# Patient Record
Sex: Female | Born: 1985 | Race: Black or African American | Hispanic: No | Marital: Married | Smoking: Never smoker
Health system: Southern US, Community
[De-identification: ages and names within clinical notes are randomized; demographics above are authoritative.]

## PROBLEM LIST (undated history)

## (undated) DIAGNOSIS — Z803 Family history of malignant neoplasm of breast: Secondary | ICD-10-CM

## (undated) DIAGNOSIS — R87619 Unspecified abnormal cytological findings in specimens from cervix uteri: Secondary | ICD-10-CM

## (undated) HISTORY — DX: Family history of malignant neoplasm of breast: Z80.3

## (undated) HISTORY — PX: COLPOSCOPY: SHX161

## (undated) HISTORY — PX: CHOLECYSTECTOMY: SHX55

## (undated) HISTORY — DX: Unspecified abnormal cytological findings in specimens from cervix uteri: R87.619

## (undated) HISTORY — PX: OTHER SURGICAL HISTORY: SHX169

---

## 2006-10-16 HISTORY — PX: WISDOM TOOTH EXTRACTION: SHX21

## 2008-12-25 ENCOUNTER — Ambulatory Visit: Payer: Self-pay | Admitting: General Practice

## 2011-04-20 ENCOUNTER — Ambulatory Visit: Payer: Self-pay | Admitting: General Practice

## 2013-01-15 ENCOUNTER — Ambulatory Visit: Payer: Self-pay | Admitting: Medical

## 2013-04-02 ENCOUNTER — Emergency Department: Payer: Self-pay | Admitting: Emergency Medicine

## 2013-04-02 LAB — URINALYSIS, COMPLETE
Blood: NEGATIVE
Nitrite: NEGATIVE
Protein: 30
RBC,UR: 6 /HPF (ref 0–5)
Specific Gravity: 1.021 (ref 1.003–1.030)

## 2013-04-02 LAB — LIPASE, BLOOD: Lipase: 150 U/L (ref 73–393)

## 2013-04-02 LAB — COMPREHENSIVE METABOLIC PANEL
Alkaline Phosphatase: 96 U/L (ref 50–136)
Anion Gap: 9 (ref 7–16)
BUN: 5 mg/dL — ABNORMAL LOW (ref 7–18)
Bilirubin,Total: 0.5 mg/dL (ref 0.2–1.0)
Calcium, Total: 9.2 mg/dL (ref 8.5–10.1)
Chloride: 104 mmol/L (ref 98–107)
Co2: 22 mmol/L (ref 21–32)
Creatinine: 0.6 mg/dL (ref 0.60–1.30)
EGFR (Non-African Amer.): >60
Osmolality: 266 (ref 275–301)
SGOT(AST): 32 U/L (ref 15–37)
Total Protein: 7.8 g/dL (ref 6.4–8.2)

## 2013-04-02 LAB — CBC
MCHC: 33.4 g/dL (ref 32.0–36.0)
MCV: 79 fL — ABNORMAL LOW (ref 80–100)
WBC: 15.1 10*3/uL — ABNORMAL HIGH (ref 3.6–11.0)

## 2013-07-02 ENCOUNTER — Observation Stay: Payer: Self-pay

## 2013-07-02 LAB — URINALYSIS, COMPLETE
Bacteria: NONE SEEN
Nitrite: NEGATIVE
Ph: 6 (ref 4.5–8.0)
Protein: 25
RBC,UR: 3 /HPF (ref 0–5)
Squamous Epithelial: 15
WBC UR: 8 /HPF (ref 0–5)

## 2013-10-06 ENCOUNTER — Observation Stay: Payer: Self-pay | Admitting: Obstetrics & Gynecology

## 2013-10-11 ENCOUNTER — Inpatient Hospital Stay: Payer: Self-pay

## 2013-10-11 LAB — CBC WITH DIFFERENTIAL/PLATELET
Basophil #: 0 10*3/uL (ref 0.0–0.1)
Basophil %: 0.2 %
Eosinophil #: 0.1 10*3/uL (ref 0.0–0.7)
Eosinophil %: 0.6 %
HGB: 10.1 g/dL — ABNORMAL LOW (ref 12.0–16.0)
Lymphocyte %: 7.8 %
MCH: 25.2 pg — ABNORMAL LOW (ref 26.0–34.0)
MCHC: 32.4 g/dL (ref 32.0–36.0)
MCV: 78 fL — ABNORMAL LOW (ref 80–100)
Monocyte #: 1.1 x10 3/mm — ABNORMAL HIGH (ref 0.2–0.9)
Monocyte %: 5.9 %
Neutrophil #: 15.9 10*3/uL — ABNORMAL HIGH (ref 1.4–6.5)
Neutrophil %: 85.5 %
Platelet: 191 10*3/uL (ref 150–440)
RDW: 17.8 % — ABNORMAL HIGH (ref 11.5–14.5)
WBC: 18.6 10*3/uL — ABNORMAL HIGH (ref 3.6–11.0)

## 2013-10-11 LAB — RUPTURE OF MEMBRANE PLUS: Rom Plus: DETECTED

## 2013-10-12 LAB — CBC
HGB: 8.8 g/dL — ABNORMAL LOW (ref 12.0–16.0)
Platelet: 193 10*3/uL (ref 150–440)
RDW: 17.5 % — ABNORMAL HIGH (ref 11.5–14.5)
WBC: 28.2 10*3/uL — ABNORMAL HIGH (ref 3.6–11.0)

## 2013-10-13 LAB — HEMATOCRIT: HCT: 27.2 % — ABNORMAL LOW (ref 35.0–47.0)

## 2013-11-11 ENCOUNTER — Ambulatory Visit: Payer: Self-pay | Admitting: Medical

## 2013-11-28 ENCOUNTER — Encounter: Payer: Self-pay | Admitting: Obstetrics and Gynecology

## 2013-12-14 ENCOUNTER — Encounter: Payer: Self-pay | Admitting: Obstetrics and Gynecology

## 2014-01-09 ENCOUNTER — Ambulatory Visit: Payer: Self-pay | Admitting: Surgery

## 2014-01-09 LAB — CBC WITH DIFFERENTIAL/PLATELET
BASOS PCT: 0.6 %
Basophil #: 0 10*3/uL (ref 0.0–0.1)
Eosinophil #: 0.1 10*3/uL (ref 0.0–0.7)
Eosinophil %: 1.9 %
HCT: 32.1 % — ABNORMAL LOW (ref 35.0–47.0)
HGB: 10.1 g/dL — ABNORMAL LOW (ref 12.0–16.0)
LYMPHS ABS: 1.8 10*3/uL (ref 1.0–3.6)
LYMPHS PCT: 23.8 %
MCH: 23.8 pg — ABNORMAL LOW (ref 26.0–34.0)
MCHC: 31.4 g/dL — AB (ref 32.0–36.0)
MCV: 76 fL — ABNORMAL LOW (ref 80–100)
MONO ABS: 0.4 x10 3/mm (ref 0.2–0.9)
Monocyte %: 5.1 %
NEUTROS ABS: 5.1 10*3/uL (ref 1.4–6.5)
Neutrophil %: 68.6 %
Platelet: 264 10*3/uL (ref 150–440)
RBC: 4.24 10*6/uL (ref 3.80–5.20)
RDW: 15.6 % — ABNORMAL HIGH (ref 11.5–14.5)
WBC: 7.4 10*3/uL (ref 3.6–11.0)

## 2014-01-09 LAB — BASIC METABOLIC PANEL
Anion Gap: 5 — ABNORMAL LOW (ref 7–16)
BUN: 7 mg/dL (ref 7–18)
CREATININE: 0.91 mg/dL (ref 0.60–1.30)
Calcium, Total: 8.8 mg/dL (ref 8.5–10.1)
Chloride: 108 mmol/L — ABNORMAL HIGH (ref 98–107)
Co2: 26 mmol/L (ref 21–32)
GLUCOSE: 81 mg/dL (ref 65–99)
Osmolality: 275 (ref 275–301)
POTASSIUM: 3.5 mmol/L (ref 3.5–5.1)
SODIUM: 139 mmol/L (ref 136–145)

## 2014-01-09 LAB — HEPATIC FUNCTION PANEL A (ARMC)
ALBUMIN: 3.4 g/dL (ref 3.4–5.0)
ALK PHOS: 119 U/L — AB
ALT: 28 U/L (ref 12–78)
Bilirubin,Total: 0.4 mg/dL (ref 0.2–1.0)
SGOT(AST): 14 U/L — ABNORMAL LOW (ref 15–37)
Total Protein: 7.7 g/dL (ref 6.4–8.2)

## 2014-01-12 LAB — PATHOLOGY REPORT

## 2014-01-14 ENCOUNTER — Encounter: Payer: Self-pay | Admitting: Obstetrics and Gynecology

## 2015-02-06 NOTE — Op Note (Signed)
PATIENT NAME:  Kilcrease, Ziona H MR#:  [id number] DATE OF BIRTH:  1986/02/15  DATE OF PROCEDURE:  10/15/2013  PREOPERATIVE DIAGNOSIS: Urgent cesarean section with probable 2 knots in the cord. (Dictation Anomaly)deep repititative variables with suggestion of a true knot in the cord with amnioinfusion  done for variables, nonresponsive to conservative measures (Dictation Anomaly) with fetal intolerance to labor remote from delivery.   POSTOPERATIVE DIAGNOSES: Deep impaction of fetal head in vagina at 5 cm dilation with true knot in the cord. (Dictation Anomaly)light meconium stained  placenta  with fetus requiring resuscitation,  intubation and chest compressions. With Apgars 3, 5, and 8 with a birth weight of 7 pounds 11 ounces, female fetus.  ESTIMATED BLOOD LOSS: 1000.  SURGEON: Elliot Gurney, M.D.  FINDINGS: Fibroid uterus with a large approximately egg sized fibroid on the anterior aspect of uterus, the same space as the baby, one in the posterior aspect of the uterus, one on the anterior aspect fundally towards the tube on the left side with bowel omentum and tube all entangled in adhesions on that side. Normal right tube and ovary with a T-scar(Dictation Anomaly)e  of  the uterus. Forever requiring a csection for delivery.  PROCEDURE: Primary cesarean section resulting in a T-shaped incision on the uterus for  delivery of the fetus.  The patient was taken to the Operating Room and placed in supine position after epidural anesthesia had been instilled.  (Dictation Anomaly)fetal heart tones  on the table were found to be 67, after (Dictation Anomaly)which stat cesarean section was performed. The incision was carried sharply down to the fascia. The fascia was nicked in the midline. The incision was extended in a superolateral manner. With the curved Mayo scissors, the anterior muscles were sharply and bluntly dissected off the rectus fascia. The muscle belly midline was identified, split, opened.  The peritoneum was grasped and opened. The bladder blade was placed and the uterine incision was made. At this time the surgeon's hand was placed down to remove the infant and the infant's head was impacted in the vagina. The infant's head was under severe suction and attempts was made to push the head up from below by the nurse. (Dictation Anomaly)the suction being released when the head was pushed out of the pelvis were heard, but the suction kept the fetus in there. After multiple attempts to remove the head, incision was extended anteriorly  (Dictation Anomaly) in attemopt to deliver  the fetus by feet first. This was not able to be done. The incision was then extended down through the midline to the cervix.  Another assistant then helped push the head up, suction was released and the infant's head was removed. The infant's cord blood was lavaged back into the infant's belly; the cord was clamped and cut and infant was handed off to a pediatrician. Attention was then turned to the patient's uterus and  the uterus was exteriorized and wrapped in a moist laparotomy sponge after delivery of the placenta. The aforementioned findings were seen. There was not much bleeding in the uterus at this time. The uterine incision was closed. First the vertical incision from the cervix up to the lower uterine segment, this was done in a locked chromic suture and then an imbricating suture on top of this. Then the vertical incision at the mid-fundus of the uterus was repaired in layers, first with a chromic locked suture and then an imbricating chromic suture. The lower uterine segment was then closed with a  locked chromic suture and then an imbricating suture. A 3-0 chromic was used to tack the bladder back up to the uterine incision. The aforementioned fibroids were seen. No attempt was made to remove these as this would possibly increase the bleeding at the uterus. At this time, (Dictation Anomaly)attention was turned to the  fibroid and clamped with Kelly clamps, cut, and suture tied to remove them.  The tube which was looped backward but then it released and was found to be in very good condition. No indentations and no cuts were identified. The uterus was then replaced back into the abdomen. The muscle bellies were closed with a running Vicryl suture after the gutters had been cleared of blood clots. Before the muscles were closed, the bladder was backfilled with sterile (Dictation Anomaly)formula  to identify any leaks or any rents or tears of the ureter. There was no spill of sterile (Dictation Anomaly)formula  into the belly.  The surgery was continued. The muscle bellies were approximated and closed with 0 Vicryl suture. The ON-Q catheters were placed and placed in between the muscle belly and the fascia. The fascia was closed with a running Vicryl suture. A large (Dictation Anomaly)TLH  plain gut suture was used to approximate the subcutaneous fat, pain pump  was placed. Bandage was placed. Uterus was massaged and there was no blood or clots at the uterus.  There was some blood-tinged urine in the catheter from prior to the repair while baby's head was being manipulated, but clear urine was noted in the bag after the milk(Dictation Anomaly)in the bag and tube.  The patient was then taken to recovery after having tolerated the procedure well.    ____________________________ Elliot Gurneyarrie C. Paschal Blanton, MD cck:sg D: 10/15/2013 09:50:31 ET T: 10/15/2013 11:08:08 ET JOB#: [id number]  cc: Elliot Gurneyarrie C. Kamarie Veno, MD, <Dictator> Elliot GurneyARRIE C Tolbert Matheson MD ELECTRONICALLY SIGNED 10/16/2013 14:52

## 2015-02-06 NOTE — Op Note (Signed)
PATIENT NAME:  Peggy Garcia, Peggy Garcia MR#:  [id number] DATE OF BIRTH:  05-11-1986  DATE OF PROCEDURE:  01/09/2014  PREOPERATIVE DIAGNOSIS: Symptomatic cholelithiasis.   POSTOPERATIVE DIAGNOSIS: Symptomatic cholelithiasis.   PROCEDURE PERFORMED:  Laparoscopic cholecystectomy.   ANESTHESIA: General endotracheal.   ESTIMATED BLOOD LOSS: Minimal.   SPECIMEN:  Gallbladder.  COMPLICATIONS: None.   INDICATION FOR SURGERY: [name] is a pleasant 29 year old female with recurrent right upper quadrant pain and gallstones. She was brought to the operating room for laparoscopic cholecystectomy.   DETAILS OF THE PROCEDURE:  Are as follows: Informed consent was obtained. Ms. Donoso was brought to the operating room suite. She was induced. Endotracheal tube was placed, general anesthesia was administered. Her abdomen was then prepped and draped in standard surgical fashion. A timeout was then performed correctly identifying patient name, operative site and procedure to be performed. A supraumbilical incision was made. It was deepened down to the fascia. The fascia was incised. The peritoneum was entered. Two stay sutures were placed through the fasciotomy. Hasson trocar was placed in the abdomen. The abdomen was insufflated. The gallbladder was visualized. It appeared to not be severely inflamed. An 11 mm epigastric and two 5 mm right subcostal trocars were placed; the latter in the midclavicular and anterior axillary line. The gallbladder was then grasped and retracted over the dome of the liver. The cystic artery and cystic duct were dissected out. A critical view was obtained. The 2 structures were clipped and ligated. The gallbladder was then taken off the gallbladder fossa with cautery and taken out with an Endo Catch bag through the supraumbilical port.  The gallbladder fossa was then examined and noted to be hemostatic. The abdomen was irrigated. The fossa was again looked at and noted to be hemostatic.  The clips appear to be in place. The abdomen was then desufflated. The supraumbilical fascia was closed with a figure-of-eight 0 Vicryl. The skin was then closed with interrupted 4-0 Monocryl deep dermal sutures. Steri-Strips, Telfa gauze and Tegaderm were used to complete the dressing. The patient was then awoken, extubated and brought to the postanesthesia care unit. There were no immediate complications. Needle, sponge  and instrument count was correct at the end of the procedure.    ____________________________ Si Raiderhristopher A. Lylith Bebeau, MD cal:dmm D: 01/09/2014 17:18:55 ET T: 01/09/2014 22:41:26 ET JOB#: [id number]  cc: Cristal Deerhristopher A. Jaquane Boughner, MD, <Dictator> Jarvis NewcomerHRISTOPHER A Jessina Marse MD ELECTRONICALLY SIGNED 01/14/2014 12:26

## 2015-02-23 NOTE — H&P (Signed)
L&D Evaluation:  History Expanded:  HPI 29 yo G1P0 at 39 weeks w bloody show today, no inciting event and no ROM or Ctxs.  [facility] without complication.   Gravida 1   Term 0   Patient's Medical History No Chronic Illness   Patient's Surgical History none   Medications Pre Natal Vitamins   Allergies NKDA   Social History none   Family History Non-Contributory   ROS:  ROS All systems were reviewed.  HEENT, CNS, GI, GU, Respiratory, CV, Renal and Musculoskeletal systems were found to be normal.   Exam:  Vital Signs stable   General no apparent distress   Mental Status clear   Abdomen gravid, non-tender   Estimated Fetal Weight Average for gestational age   Back no CVAT   Edema no edema   Pelvic no external lesions, FT, 50%, -2, no bleeding   Mebranes Intact   FHT normal rate with no decels, R Non-Stress Test   Ucx absent   Skin dry   Impression:  Impression Vag bleeding, no signs of labor.   Plan:  Plan EFM/NST, monitor contractions and for cervical change   Comments Two exams, no cervical change.  Labor precautions discussed.  Appt Friday, Induction of labor Monday.   Electronic Signatures: Letitia LibraHarris, Holiday Mcmenamin Paul (MD)  (Signed 22-Dec-14 12:35)  Authored: L&D Evaluation   Last Updated: 22-Dec-14 12:35 by Letitia LibraHarris, Lugene Beougher Paul (MD)

## 2015-02-23 NOTE — H&P (Signed)
L&D Evaluation:  History:  HPI Pt is a 29 yo G2P0010 at 26.2 weeks who presents to L&D with reports of left lower abdominal pain since monday. She was seen in the office yesterday and was given comfort measures and medication for a presumed fibroid seen on a previous u/s which could be causing some discomfort. Pt denied any urinary or vaginal symptoms at that time. Pt states that the tramadol is not working and she continues to have pain. She admits that she has not taken anything for pain since 7am today. She reports +FM, and denies VB, lof, or ctx. She is B+, RI, VI.   Presents with abdominal pain   Patient's Medical History No Chronic Illness   Patient's Surgical History colpo, widsom teeth removal   Medications Pre Natal Vitamins   Allergies NKDA   ROS:  General normal   HEENT normal   CNS normal   GI normal   GU normal   Resp normal   CV normal   Renal normal   MS c/o right abdominal pain   Exam:  Vital Signs stable   Urine Protein 25 on UA   General no apparent distress   Mental Status clear   Chest clear   Heart normal sinus rhythm   Abdomen gravid, non-tender   Back no CVAT, some diffuse back pain.   Edema no edema   Mebranes Intact   FHT normal rate with no decels   Ucx absent   Skin dry, no lesions   Lymph no lymphadenopathy   Other urine- 2+ketones, specific gravity 1.015, 1+blood, 25 protein, negative nitrites, 1+leuks, 3 RBCs, 8 WBCs, no bacteria   Impression:  Impression UTI, reactive NST   Plan:  Plan fluids, antibiotics, will get u/s of abdomen   Follow Up Appointment need to schedule. [facility]   Electronic Signatures: Jannet MantisSubudhi, Briany Aye (CNM)  (Signed 17-Sep-14 17:41)  Authored: L&D Evaluation   Last Updated: 17-Sep-14 17:41 by Jannet MantisSubudhi, Janecia Palau (CNM)

## 2015-02-23 NOTE — H&P (Signed)
L&D Evaluation:  History Expanded:  HPI 29 yo G2P0010 at 6740 w 4days who was to be induced tomorrow night presents in labor at 3-4 changing to 4-5 on her own, she was a [facility] 9 she got tdap on 11/10. she is GBS pos and she is ruptured,   Gravida 2   Term 0   PreTerm 0   Abortion 1   Living 0   Blood Type (Maternal) B positive   Group B Strep Results Maternal (Result >5wks must be treated as unknown) positive   Maternal HIV Negative   Maternal Syphilis Ab Nonreactive   Maternal Varicella Immune   Rubella Results (Maternal) immune   Maternal T-Dap Immune   EDC 07-Oct-2013   Presents with contractions, srom   Patient's Medical History No Chronic Illness   Patient's Surgical History none   Medications Pre Natal Vitamins   Allergies NKDA   Social History none   Family History Non-Contributory   ROS:  ROS All systems were reviewed.  HEENT, CNS, GI, GU, Respiratory, CV, Renal and Musculoskeletal systems were found to be normal.   Exam:  Vital Signs stable   Urine Protein not completed   General no apparent distress   Mental Status clear   Chest clear   Heart normal sinus rhythm   Abdomen gravid, tender with contractions   Estimated Fetal Weight Average for gestational age   Fetal Position v   Fundal Height term   Back no CVAT   Edema no edema   Reflexes 1+   Mebranes Ruptured   Description clear   FHT normal rate with no decels, cat 1   Fetal Heart Rate 140   Ucx irregular   Skin dry   Lymph no lymphadenopathy   Impression:  Impression early labor   Plan:  Plan UA, antibiotics for GBBS prophylaxis   Comments start GBS prophylaxis and  anticipate SVD   Follow Up Appointment need to schedule. in 6 weeks   Electronic Signatures: Adria DevonKlett, Ahmed Inniss (MD)  (Signed 27-Dec-14 21:45)  Authored: L&D Evaluation   Last Updated: 27-Dec-14 21:45 by Adria DevonKlett, Sharalee Witman (MD)

## 2016-05-22 DIAGNOSIS — Z98891 History of uterine scar from previous surgery: Secondary | ICD-10-CM | POA: Insufficient documentation

## 2016-05-22 DIAGNOSIS — Z6841 Body Mass Index (BMI) 40.0 and over, adult: Secondary | ICD-10-CM | POA: Insufficient documentation

## 2016-05-22 DIAGNOSIS — Z836 Family history of other diseases of the respiratory system: Secondary | ICD-10-CM | POA: Insufficient documentation

## 2016-07-31 DIAGNOSIS — Z836 Family history of other diseases of the respiratory system: Secondary | ICD-10-CM | POA: Diagnosis not present

## 2016-07-31 DIAGNOSIS — O3410 Maternal care for benign tumor of corpus uteri, unspecified trimester: Secondary | ICD-10-CM | POA: Diagnosis not present

## 2016-07-31 DIAGNOSIS — O4440 Low lying placenta NOS or without hemorrhage, unspecified trimester: Secondary | ICD-10-CM | POA: Diagnosis not present

## 2016-07-31 DIAGNOSIS — Z3A08 8 weeks gestation of pregnancy: Secondary | ICD-10-CM | POA: Diagnosis not present

## 2016-07-31 DIAGNOSIS — D259 Leiomyoma of uterus, unspecified: Secondary | ICD-10-CM | POA: Diagnosis not present

## 2016-07-31 DIAGNOSIS — O99211 Obesity complicating pregnancy, first trimester: Secondary | ICD-10-CM | POA: Diagnosis not present

## 2016-07-31 DIAGNOSIS — Z87440 Personal history of urinary (tract) infections: Secondary | ICD-10-CM | POA: Diagnosis not present

## 2016-07-31 DIAGNOSIS — O99210 Obesity complicating pregnancy, unspecified trimester: Secondary | ICD-10-CM | POA: Diagnosis not present

## 2016-07-31 DIAGNOSIS — Z98891 History of uterine scar from previous surgery: Secondary | ICD-10-CM | POA: Diagnosis not present

## 2016-07-31 DIAGNOSIS — Z3A18 18 weeks gestation of pregnancy: Secondary | ICD-10-CM | POA: Diagnosis not present

## 2016-10-06 DIAGNOSIS — Z3A33 33 weeks gestation of pregnancy: Secondary | ICD-10-CM | POA: Diagnosis not present

## 2016-10-06 DIAGNOSIS — O99210 Obesity complicating pregnancy, unspecified trimester: Secondary | ICD-10-CM | POA: Diagnosis not present

## 2016-10-06 DIAGNOSIS — Z3A27 27 weeks gestation of pregnancy: Secondary | ICD-10-CM | POA: Diagnosis not present

## 2016-10-06 DIAGNOSIS — Z23 Encounter for immunization: Secondary | ICD-10-CM | POA: Diagnosis not present

## 2016-10-06 DIAGNOSIS — O2340 Unspecified infection of urinary tract in pregnancy, unspecified trimester: Secondary | ICD-10-CM | POA: Diagnosis not present

## 2016-10-06 DIAGNOSIS — O4440 Low lying placenta NOS or without hemorrhage, unspecified trimester: Secondary | ICD-10-CM | POA: Diagnosis not present

## 2016-10-06 DIAGNOSIS — Z98891 History of uterine scar from previous surgery: Secondary | ICD-10-CM | POA: Diagnosis not present

## 2016-10-06 DIAGNOSIS — O99810 Abnormal glucose complicating pregnancy: Secondary | ICD-10-CM | POA: Diagnosis not present

## 2016-10-20 DIAGNOSIS — Z3A29 29 weeks gestation of pregnancy: Secondary | ICD-10-CM | POA: Diagnosis not present

## 2016-10-20 DIAGNOSIS — Z23 Encounter for immunization: Secondary | ICD-10-CM | POA: Diagnosis not present

## 2016-10-20 DIAGNOSIS — O99210 Obesity complicating pregnancy, unspecified trimester: Secondary | ICD-10-CM | POA: Diagnosis not present

## 2016-10-20 DIAGNOSIS — Z98891 History of uterine scar from previous surgery: Secondary | ICD-10-CM | POA: Diagnosis not present

## 2016-11-06 DIAGNOSIS — Z6841 Body Mass Index (BMI) 40.0 and over, adult: Secondary | ICD-10-CM | POA: Diagnosis not present

## 2016-11-06 DIAGNOSIS — O99013 Anemia complicating pregnancy, third trimester: Secondary | ICD-10-CM | POA: Diagnosis not present

## 2016-11-06 DIAGNOSIS — O99210 Obesity complicating pregnancy, unspecified trimester: Secondary | ICD-10-CM | POA: Diagnosis not present

## 2016-11-06 DIAGNOSIS — O99213 Obesity complicating pregnancy, third trimester: Secondary | ICD-10-CM | POA: Diagnosis not present

## 2016-11-06 DIAGNOSIS — Z369 Encounter for antenatal screening, unspecified: Secondary | ICD-10-CM | POA: Diagnosis not present

## 2016-11-06 DIAGNOSIS — Z3A27 27 weeks gestation of pregnancy: Secondary | ICD-10-CM | POA: Diagnosis not present

## 2016-11-06 DIAGNOSIS — Z98891 History of uterine scar from previous surgery: Secondary | ICD-10-CM | POA: Diagnosis not present

## 2016-11-06 DIAGNOSIS — O3410 Maternal care for benign tumor of corpus uteri, unspecified trimester: Secondary | ICD-10-CM | POA: Diagnosis not present

## 2016-11-06 DIAGNOSIS — Z3A32 32 weeks gestation of pregnancy: Secondary | ICD-10-CM | POA: Diagnosis not present

## 2016-11-06 DIAGNOSIS — D259 Leiomyoma of uterus, unspecified: Secondary | ICD-10-CM | POA: Diagnosis not present

## 2016-11-06 DIAGNOSIS — O2340 Unspecified infection of urinary tract in pregnancy, unspecified trimester: Secondary | ICD-10-CM | POA: Diagnosis not present

## 2016-11-16 DIAGNOSIS — K080 Exfoliation of teeth due to systemic causes: Secondary | ICD-10-CM | POA: Diagnosis not present

## 2016-11-27 DIAGNOSIS — Z98891 History of uterine scar from previous surgery: Secondary | ICD-10-CM | POA: Diagnosis not present

## 2016-11-27 DIAGNOSIS — O99013 Anemia complicating pregnancy, third trimester: Secondary | ICD-10-CM | POA: Diagnosis not present

## 2016-11-27 DIAGNOSIS — O99210 Obesity complicating pregnancy, unspecified trimester: Secondary | ICD-10-CM | POA: Diagnosis not present

## 2016-11-27 DIAGNOSIS — Z3A35 35 weeks gestation of pregnancy: Secondary | ICD-10-CM | POA: Diagnosis not present

## 2016-12-04 DIAGNOSIS — O99210 Obesity complicating pregnancy, unspecified trimester: Secondary | ICD-10-CM | POA: Diagnosis not present

## 2016-12-04 DIAGNOSIS — Z3A36 36 weeks gestation of pregnancy: Secondary | ICD-10-CM | POA: Diagnosis not present

## 2016-12-04 DIAGNOSIS — O99013 Anemia complicating pregnancy, third trimester: Secondary | ICD-10-CM | POA: Diagnosis not present

## 2016-12-04 DIAGNOSIS — Z98891 History of uterine scar from previous surgery: Secondary | ICD-10-CM | POA: Diagnosis not present

## 2016-12-12 DIAGNOSIS — D649 Anemia, unspecified: Secondary | ICD-10-CM | POA: Diagnosis not present

## 2016-12-12 DIAGNOSIS — O9902 Anemia complicating childbirth: Secondary | ICD-10-CM | POA: Diagnosis not present

## 2016-12-12 DIAGNOSIS — Z87440 Personal history of urinary (tract) infections: Secondary | ICD-10-CM | POA: Diagnosis not present

## 2016-12-12 DIAGNOSIS — K219 Gastro-esophageal reflux disease without esophagitis: Secondary | ICD-10-CM | POA: Diagnosis not present

## 2016-12-12 DIAGNOSIS — D259 Leiomyoma of uterus, unspecified: Secondary | ICD-10-CM | POA: Diagnosis not present

## 2016-12-12 DIAGNOSIS — Z836 Family history of other diseases of the respiratory system: Secondary | ICD-10-CM | POA: Diagnosis not present

## 2016-12-12 DIAGNOSIS — O34211 Maternal care for low transverse scar from previous cesarean delivery: Secondary | ICD-10-CM | POA: Diagnosis not present

## 2016-12-12 DIAGNOSIS — O99613 Diseases of the digestive system complicating pregnancy, third trimester: Secondary | ICD-10-CM | POA: Diagnosis not present

## 2016-12-12 DIAGNOSIS — O99214 Obesity complicating childbirth: Secondary | ICD-10-CM | POA: Diagnosis not present

## 2016-12-12 DIAGNOSIS — E669 Obesity, unspecified: Secondary | ICD-10-CM | POA: Diagnosis not present

## 2016-12-12 DIAGNOSIS — O3413 Maternal care for benign tumor of corpus uteri, third trimester: Secondary | ICD-10-CM | POA: Diagnosis not present

## 2016-12-12 DIAGNOSIS — O99210 Obesity complicating pregnancy, unspecified trimester: Secondary | ICD-10-CM | POA: Diagnosis not present

## 2016-12-12 DIAGNOSIS — O99212 Obesity complicating pregnancy, second trimester: Secondary | ICD-10-CM | POA: Diagnosis not present

## 2016-12-12 DIAGNOSIS — R768 Other specified abnormal immunological findings in serum: Secondary | ICD-10-CM | POA: Diagnosis not present

## 2016-12-12 DIAGNOSIS — Z3A01 Less than 8 weeks gestation of pregnancy: Secondary | ICD-10-CM | POA: Diagnosis not present

## 2016-12-12 DIAGNOSIS — Z3A37 37 weeks gestation of pregnancy: Secondary | ICD-10-CM | POA: Diagnosis not present

## 2016-12-12 DIAGNOSIS — O34212 Maternal care for vertical scar from previous cesarean delivery: Secondary | ICD-10-CM | POA: Diagnosis not present

## 2016-12-12 DIAGNOSIS — O3410 Maternal care for benign tumor of corpus uteri, unspecified trimester: Secondary | ICD-10-CM | POA: Diagnosis not present

## 2016-12-12 DIAGNOSIS — Z6841 Body Mass Index (BMI) 40.0 and over, adult: Secondary | ICD-10-CM | POA: Diagnosis not present

## 2016-12-13 DIAGNOSIS — R768 Other specified abnormal immunological findings in serum: Secondary | ICD-10-CM | POA: Insufficient documentation

## 2016-12-13 DIAGNOSIS — O34212 Maternal care for vertical scar from previous cesarean delivery: Secondary | ICD-10-CM | POA: Diagnosis not present

## 2016-12-13 DIAGNOSIS — Z3A37 37 weeks gestation of pregnancy: Secondary | ICD-10-CM | POA: Diagnosis not present

## 2016-12-13 DIAGNOSIS — R7689 Other specified abnormal immunological findings in serum: Secondary | ICD-10-CM | POA: Insufficient documentation

## 2016-12-13 DIAGNOSIS — O9902 Anemia complicating childbirth: Secondary | ICD-10-CM | POA: Diagnosis not present

## 2016-12-13 DIAGNOSIS — O99212 Obesity complicating pregnancy, second trimester: Secondary | ICD-10-CM | POA: Diagnosis not present

## 2017-01-26 DIAGNOSIS — Z3049 Encounter for surveillance of other contraceptives: Secondary | ICD-10-CM | POA: Diagnosis not present

## 2017-01-26 DIAGNOSIS — Z30017 Encounter for initial prescription of implantable subdermal contraceptive: Secondary | ICD-10-CM | POA: Diagnosis not present

## 2017-06-05 DIAGNOSIS — K080 Exfoliation of teeth due to systemic causes: Secondary | ICD-10-CM | POA: Diagnosis not present

## 2017-08-21 ENCOUNTER — Ambulatory Visit: Payer: Self-pay | Admitting: Medical

## 2017-08-21 ENCOUNTER — Ambulatory Visit
Admission: RE | Admit: 2017-08-21 | Discharge: 2017-08-21 | Disposition: A | Discharge status: Home / Self Care | Payer: Federal, State, Local not specified - PPO | Source: Ambulatory Visit | Attending: Medical | Admitting: Medical

## 2017-08-21 ENCOUNTER — Encounter: Payer: Self-pay | Admitting: Medical

## 2017-08-21 VITALS — BP 112/74 | HR 108 | Temp 99.7°F | Resp 16 | Ht 62.0 in | Wt 209.0 lb

## 2017-08-21 DIAGNOSIS — G8929 Other chronic pain: Secondary | ICD-10-CM | POA: Diagnosis not present

## 2017-08-21 DIAGNOSIS — M545 Low back pain, unspecified: Secondary | ICD-10-CM

## 2017-08-21 DIAGNOSIS — H6502 Acute serous otitis media, left ear: Secondary | ICD-10-CM

## 2017-08-21 DIAGNOSIS — J029 Acute pharyngitis, unspecified: Secondary | ICD-10-CM

## 2017-08-21 MED ORDER — AMOXICILLIN-POT CLAVULANATE 875-125 MG PO TABS: 1.0000 | ORAL_TABLET | Freq: Two times a day (BID) | ORAL | 0 refills | Status: DC

## 2017-08-21 NOTE — Patient Instructions (Addendum)
Pharyngitis Pharyngitis is a sore throat (pharynx). There is redness, pain, and swelling of your throat. Follow these instructions at home:  Drink enough fluids to keep your pee (urine) clear or pale yellow.  Only take medicine as told by your doctor. ? You may get sick again if you do not take medicine as told. Finish your medicines, even if you start to feel better. ? Do not take aspirin.  Rest.  Rinse your mouth (gargle) with salt water ( tsp of salt per 1 qt of water) every 1-2 hours. This will help the pain.  If you are not at risk for choking, you can suck on hard candy or sore throat lozenges. Contact a doctor if:  You have large, tender lumps on your neck.  You have a rash.  You cough up green, yellow-brown, or bloody spit. Get help right away if:  You have a stiff neck.  You drool or cannot swallow liquids.  You throw up (vomit) or are not able to keep medicine or liquids down.  You have very bad pain that does not go away with medicine.  You have problems breathing (not from a stuffy nose). This information is not intended to replace advice given to you by your health care provider. Make sure you discuss any questions you have with your health care provider. Document Released: 03/20/2008 Document Revised: 03/09/2016 Document Reviewed: 06/09/2013 Elsevier Interactive Patient Education  2017 [employer] Otitis Media, Adult Otitis media is redness, soreness, and puffiness (swelling) in the space just behind your eardrum (middle ear). It may be caused by allergies or infection. It often happens along with a cold. Follow these instructions at home:  Take your medicine as told. Finish it even if you start to feel better.  Only take over-the-counter or prescription medicines for pain, discomfort, or fever as told by your doctor.  Follow up with your doctor as told. Contact a doctor if:  You have otitis media only in one ear, or bleeding from your nose, or  both.  You notice a lump on your neck.  You are not getting better in 3-5 days.  You feel worse instead of better. Get help right away if:  You have pain that is not helped with medicine.  You have puffiness, redness, or pain around your ear.  You get a stiff neck.  You cannot move part of your face (paralysis).  You notice that the bone behind your ear hurts when you touch it. This information is not intended to replace advice given to you by your health care provider. Make sure you discuss any questions you have with your health care provider. Document Released: 03/20/2008 Document Revised: 03/09/2016 Document Reviewed: 04/29/2013 Elsevier Interactive Patient Education  2017 [employer] Back ex Back Exercises If you have pain in your back, do these exercises 2-3 times each day or as told by your doctor. When the pain goes away, do the exercises once each day, but repeat the steps more times for each exercise (do more repetitions). If you do not have pain in your back, do these exercises once each day or as told by your doctor. Exercises Single Knee to Chest  Do these steps 3-5 times in a row for each leg: 1. Lie on your back on a firm bed or the floor with your legs stretched out. 2. Bring one knee to your chest. 3. Hold your knee to your chest by grabbing your knee or thigh. 4. Pull on your knee until  you feel a gentle stretch in your lower back. 5. Keep doing the stretch for 10-30 seconds. 6. Slowly let go of your leg and straighten it.  Pelvic Tilt  Do these steps 5-10 times in a row: 1. Lie on your back on a firm bed or the floor with your legs stretched out. 2. Bend your knees so they point up to the ceiling. Your feet should be flat on the floor. 3. Tighten your lower belly (abdomen) muscles to press your lower back against the floor. This will make your tailbone point up to the ceiling instead of pointing down to your feet or the floor. 4. Stay in this position  for 5-10 seconds while you gently tighten your muscles and breathe evenly.  Cat-Cow  Do these steps until your lower back bends more easily: 1. Get on your hands and knees on a firm surface. Keep your hands under your shoulders, and keep your knees under your hips. You may put padding under your knees. 2. Let your head hang down, and make your tailbone point down to the floor so your lower back is round like the back of a cat. 3. Stay in this position for 5 seconds. 4. Slowly lift your head and make your tailbone point up to the ceiling so your back hangs low (sags) like the back of a cow. 5. Stay in this position for 5 seconds.  Press-Ups  Do these steps 5-10 times in a row: 1. Lie on your belly (face-down) on the floor. 2. Place your hands near your head, about shoulder-width apart. 3. While you keep your back relaxed and keep your hips on the floor, slowly straighten your arms to raise the top half of your body and lift your shoulders. Do not use your back muscles. To make yourself more comfortable, you may change where you place your hands. 4. Stay in this position for 5 seconds. 5. Slowly return to lying flat on the floor.  Bridges  Do these steps 10 times in a row: 1. Lie on your back on a firm surface. 2. Bend your knees so they point up to the ceiling. Your feet should be flat on the floor. 3. Tighten your butt muscles and lift your butt off of the floor until your waist is almost as high as your knees. If you do not feel the muscles working in your butt and the back of your thighs, slide your feet 1-2 inches farther away from your butt. 4. Stay in this position for 3-5 seconds. 5. Slowly lower your butt to the floor, and let your butt muscles relax.  If this exercise is too easy, try doing it with your arms crossed over your chest. Belly Crunches  Do these steps 5-10 times in a row: 1. Lie on your back on a firm bed or the floor with your legs stretched out. 2. Bend your  knees so they point up to the ceiling. Your feet should be flat on the floor. 3. Cross your arms over your chest. 4. Tip your chin a little bit toward your chest but do not bend your neck. 5. Tighten your belly muscles and slowly raise your chest just enough to lift your shoulder blades a tiny bit off of the floor. 6. Slowly lower your chest and your head to the floor.  Back Lifts Do these steps 5-10 times in a row: 1. Lie on your belly (face-down) with your arms at your sides, and rest your forehead on the  floor. 2. Tighten the muscles in your legs and your butt. 3. Slowly lift your chest off of the floor while you keep your hips on the floor. Keep the back of your head in line with the curve in your back. Look at the floor while you do this. 4. Stay in this position for 3-5 seconds. 5. Slowly lower your chest and your face to the floor.  Contact a doctor if:  Your back pain gets a lot worse when you do an exercise.  Your back pain does not lessen 2 hours after you exercise. If you have any of these problems, stop doing the exercises. Do not do them again unless your doctor says it is okay. Get help right away if:  You have sudden, very bad back pain. If this happens, stop doing the exercises. Do not do them again unless your doctor says it is okay. This information is not intended to replace advice given to you by your health care provider. Make sure you discuss any questions you have with your health care provider. Document Released: 11/04/2010 Document Revised: 03/09/2016 Document Reviewed: 11/26/2014 Elsevier Interactive Patient Education  [employer]2018 [employer]

## 2017-08-21 NOTE — Progress Notes (Signed)
   Subjective:    Patient ID: Peggy Garcia, female    DOB: 01/17/1986, 31 y.o.   MRN: [id number]  HPI 31 yo female in non acute distress,  Complains of ears tender and stopped up feeling but no change in hearing and throat is sore.. Woke up yesterday  with a tickle in throat, worse last night and better this am.   Back pain after childbirth, sometimes goes down into the right leg, no loss of bowel or bladder, numbness in the right leg at times none now and sometimes it  feels like it is going to give out on her.   Review of Systems  Constitutional: Positive for chills. Negative for activity change, appetite change, fatigue and fever.  HENT: Positive for ear pain (both), postnasal drip and sore throat (better today). Negative for congestion, ear discharge, hearing loss, nosebleeds, rhinorrhea, sinus pressure, sinus pain, sneezing and voice change.   Eyes: Negative for discharge and itching.  Respiratory: Negative for cough, chest tightness and shortness of breath.   Cardiovascular: Negative for chest pain, palpitations and leg swelling.  Gastrointestinal: Negative for abdominal pain.  Endocrine: Negative for cold intolerance and heat intolerance.  Genitourinary: Negative for dysuria.  Musculoskeletal: Negative for myalgias.  Skin: Negative for rash.  Allergic/Immunologic: Negative for environmental allergies and food allergies.  Neurological: Negative for dizziness, syncope and light-headedness.  Hematological: Positive for adenopathy.  Psychiatric/Behavioral: Negative for behavioral problems, self-injury and suicidal ideas. The patient is not nervous/anxious.        Objective:   Physical Exam  Constitutional: She is oriented to person, place, and time. She appears well-developed and well-nourished.  HENT:  Head: Normocephalic and atraumatic.  Right Ear: Hearing, external ear and ear canal normal. A middle ear effusion is present.  Left Ear: Hearing, external ear and ear canal  normal. Tympanic membrane is erythematous.  Mouth/Throat: Uvula is midline and mucous membranes are normal. Posterior oropharyngeal edema and posterior oropharyngeal erythema present. No oropharyngeal exudate or tonsillar abscesses.  Eyes: Conjunctivae and EOM are normal. Pupils are equal, round, and reactive to light.  Neck: Normal range of motion.  Cardiovascular: Normal rate, regular rhythm and normal heart sounds.  Pulmonary/Chest: Effort normal and breath sounds normal.  Lymphadenopathy:    She has cervical adenopathy.  Neurological: She is alert and oriented to person, place, and time.  Skin: Skin is warm and dry.  Psychiatric: She has a normal mood and affect. Her behavior is normal. Judgment and thought content normal.  Nursing note and vitals reviewed.         Assessment & Plan:  Otitis media Left, Pharyngitis, Low back pain right sided sciatica at times none today. Not Breast feeding.  recommended  Dilut salt water gargles , OTC Zyrtec and Claritin  OTC Mucinex for fluid behind TM.  Back Lumbar x-ray will call patient tomorrow am. 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  Lumbar spine x-ray EXAM: LUMBAR SPINE - COMPLETE 4+ VIEW  COMPARISON:  None.  FINDINGS: Surgical clips in the right upper quadrant.  IMPRESSION: Negative.   Electronically Signed   By: Jasmine PangKim  Fujinaga M.D.   On: 08/22/2017 03:21  Telephone call to patient of results by RN  JCummings, patient would like to try some physical therapy. Will have JCummings RN refer patient to [facility].

## 2017-08-22 ENCOUNTER — Telehealth: Payer: Self-pay

## 2017-08-22 NOTE — Telephone Encounter (Signed)
Called patient and explained that xray of her low back was normal/negative.  Advised that provider offered recommendation of PT.  Patient stated she was interested in pursuing PT.  Messaged provider Ratcliffe to put in an order for PT.

## 2017-08-27 NOTE — Addendum Note (Signed)
Addended by: Mckenize Mezera R on: 08/27/2017 11:36 AM   Modules accepted: Orders  

## 2017-09-18 ENCOUNTER — Ambulatory Visit: Payer: Federal, State, Local not specified - PPO | Attending: Medical

## 2017-09-18 DIAGNOSIS — R252 Cramp and spasm: Secondary | ICD-10-CM

## 2017-09-18 DIAGNOSIS — M5416 Radiculopathy, lumbar region: Secondary | ICD-10-CM

## 2017-09-18 DIAGNOSIS — R293 Abnormal posture: Secondary | ICD-10-CM
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
## 2017-09-24 ENCOUNTER — Ambulatory Visit: Payer: Federal, State, Local not specified - PPO

## 2017-09-26 ENCOUNTER — Ambulatory Visit: Payer: Federal, State, Local not specified - PPO

## 2017-09-26 DIAGNOSIS — M5416 Radiculopathy, lumbar region: Secondary | ICD-10-CM | POA: Diagnosis not present

## 2017-09-26 DIAGNOSIS — R293 Abnormal posture: Secondary | ICD-10-CM | POA: Diagnosis not present

## 2017-09-26 DIAGNOSIS — R252 Cramp and spasm: Secondary | ICD-10-CM | POA: Diagnosis not present
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
## 2017-10-02 ENCOUNTER — Ambulatory Visit: Payer: Federal, State, Local not specified - PPO

## 2017-10-02 DIAGNOSIS — R293 Abnormal posture: Secondary | ICD-10-CM | POA: Diagnosis not present

## 2017-10-02 DIAGNOSIS — M5416 Radiculopathy, lumbar region: Secondary | ICD-10-CM

## 2017-10-02 DIAGNOSIS — R252 Cramp and spasm: Secondary | ICD-10-CM
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
## 2017-10-04 ENCOUNTER — Ambulatory Visit: Payer: Federal, State, Local not specified - PPO

## 2017-10-04 DIAGNOSIS — M5416 Radiculopathy, lumbar region: Secondary | ICD-10-CM | POA: Diagnosis not present

## 2017-10-04 DIAGNOSIS — R252 Cramp and spasm: Secondary | ICD-10-CM

## 2017-10-04 DIAGNOSIS — R293 Abnormal posture: Secondary | ICD-10-CM
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
## 2017-10-18 ENCOUNTER — Ambulatory Visit: Payer: Federal, State, Local not specified - PPO | Attending: Medical

## 2017-10-18 DIAGNOSIS — R252 Cramp and spasm: Secondary | ICD-10-CM | POA: Insufficient documentation

## 2017-10-18 DIAGNOSIS — M5416 Radiculopathy, lumbar region: Secondary | ICD-10-CM | POA: Insufficient documentation

## 2017-10-18 DIAGNOSIS — R293 Abnormal posture: Secondary | ICD-10-CM | POA: Insufficient documentation

## 2017-10-22 ENCOUNTER — Ambulatory Visit: Payer: Federal, State, Local not specified - PPO

## 2017-10-22 DIAGNOSIS — R252 Cramp and spasm: Secondary | ICD-10-CM

## 2017-10-22 DIAGNOSIS — M5416 Radiculopathy, lumbar region: Secondary | ICD-10-CM

## 2017-10-22 DIAGNOSIS — R293 Abnormal posture: Secondary | ICD-10-CM

## 2017-10-22 NOTE — Addendum Note (Signed)
Addended by: Bethanie DickerISSELL, [place] V on: 10/22/2017 06:24 PM   Modules accepted: Orders
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
## 2017-10-24 ENCOUNTER — Ambulatory Visit: Payer: Federal, State, Local not specified - PPO

## 2017-10-24 DIAGNOSIS — R252 Cramp and spasm: Secondary | ICD-10-CM | POA: Diagnosis not present

## 2017-10-24 DIAGNOSIS — R293 Abnormal posture: Secondary | ICD-10-CM | POA: Diagnosis not present

## 2017-10-24 DIAGNOSIS — M5416 Radiculopathy, lumbar region: Secondary | ICD-10-CM
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
## 2017-10-29 ENCOUNTER — Ambulatory Visit: Payer: Federal, State, Local not specified - PPO

## 2017-10-29 DIAGNOSIS — M5416 Radiculopathy, lumbar region: Secondary | ICD-10-CM | POA: Diagnosis not present

## 2017-10-29 DIAGNOSIS — R252 Cramp and spasm: Secondary | ICD-10-CM

## 2017-10-29 DIAGNOSIS — R293 Abnormal posture: Secondary | ICD-10-CM
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
## 2017-11-01 ENCOUNTER — Ambulatory Visit: Payer: Federal, State, Local not specified - PPO

## 2017-11-01 DIAGNOSIS — R252 Cramp and spasm: Secondary | ICD-10-CM | POA: Diagnosis not present

## 2017-11-01 DIAGNOSIS — R293 Abnormal posture: Secondary | ICD-10-CM | POA: Diagnosis not present

## 2017-11-01 DIAGNOSIS — M5416 Radiculopathy, lumbar region: Secondary | ICD-10-CM
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
## 2017-11-05 ENCOUNTER — Ambulatory Visit: Payer: Federal, State, Local not specified - PPO

## 2017-11-05 DIAGNOSIS — M5416 Radiculopathy, lumbar region: Secondary | ICD-10-CM

## 2017-11-05 DIAGNOSIS — R293 Abnormal posture: Secondary | ICD-10-CM | POA: Diagnosis not present

## 2017-11-05 DIAGNOSIS — R252 Cramp and spasm: Secondary | ICD-10-CM
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
## 2017-11-07 ENCOUNTER — Ambulatory Visit: Payer: Self-pay | Admitting: Adult Health

## 2017-11-07 ENCOUNTER — Encounter: Payer: Self-pay | Admitting: Adult Health

## 2017-11-07 VITALS — BP 110/80 | HR 96 | Temp 98.0°F | Resp 16 | Ht 62.0 in | Wt 211.0 lb

## 2017-11-07 DIAGNOSIS — R059 Cough, unspecified: Secondary | ICD-10-CM

## 2017-11-07 DIAGNOSIS — R0982 Postnasal drip: Secondary | ICD-10-CM

## 2017-11-07 DIAGNOSIS — J40 Bronchitis, not specified as acute or chronic: Secondary | ICD-10-CM

## 2017-11-07 DIAGNOSIS — R05 Cough: Secondary | ICD-10-CM

## 2017-11-07 DIAGNOSIS — R062 Wheezing: Secondary | ICD-10-CM

## 2017-11-07 MED ORDER — AZITHROMYCIN 250 MG PO TABS: ORAL_TABLET | ORAL | 0 refills | Status: DC

## 2017-11-07 MED ORDER — PREDNISONE 10 MG (21) PO TBPK: ORAL_TABLET | ORAL | 0 refills | Status: DC

## 2017-11-07 MED ORDER — IPRATROPIUM-ALBUTEROL 0.5-2.5 (3) MG/3ML IN SOLN
3.0000 mL | Freq: Once | RESPIRATORY_TRACT | Status: AC
  Administered 2017-11-07: 3 mL via RESPIRATORY_TRACT

## 2017-11-07 MED ORDER — FLUTICASONE PROPIONATE 50 MCG/ACT NA SUSP: 2.0000 | Freq: Every day | NASAL | 0 refills | Status: DC

## 2017-11-07 MED ORDER — ALBUTEROL SULFATE HFA 108 (90 BASE) MCG/ACT IN AERS: 2.0000 | INHALATION_SPRAY | Freq: Four times a day (QID) | RESPIRATORY_TRACT | 0 refills | Status: DC | PRN

## 2017-11-07 NOTE — Progress Notes (Signed)
Subjective:     Patient ID: Peggy Garcia, female   DOB: 10/13/1986, 32 y.o.   MRN: [id number]  HPI  Patient is a female in no acute distress who has had a cough/ cold since this past Sunday. Dry throat and chest congestion worsening. No over the counter medications. Denies cough worse at night or any respiratory distress. Denies any edema. She reports her chest congestion is bothering her the most as the sinus symptoms are improving since onset.   Last office visit here in this clinic with colleage was 08/21/2017 and she was on Augmentin for Otitis media.  Sheh denies any chronic respiratiory or recent respiratory infections or other illness. She denies any history of pneumonia in the past.  She  denies asthma history but does reports she has been given Albuterol inhaler for wheezing in the past with bronchitis.  She reports she is working today. She denies any fatigue.  has Family history of interstitial lung disease and History of cesarean section, classical on their problem list.   Appetite good.  Family has been sick at home on and off. She has two young children. She denies any recent strep exposure of self history.   Patient  denies any fever, chills, rash, chest pain, shortness of breath, nausea, vomiting, or diarrhea.  She denies any other complaints at this time.   Blood pressure 110/80, pulse 96, temperature 98 F (36.7 C), resp. rate 16, height 5\' 2"  (1.575 m), weight 211 lb (95.7 kg), SpO2 97 %.    Review of Systems  Constitutional: Negative.   HENT: Positive for congestion, ear pain (right ), postnasal drip and sore throat (" dry throat" ).   Eyes: Negative.   Respiratory: Positive for cough.   Cardiovascular: Negative.   Gastrointestinal: Negative.   Genitourinary: Negative.   Musculoskeletal: Negative.   Skin: Negative.   Allergic/Immunologic: Negative.   Neurological: Negative.       She denies asthma but does report she has had to use Albuterol inhaler before  with past bronchitis.   Objective:   Physical Exam  Constitutional: She is oriented to person, place, and time. Vital signs are normal. She appears well-developed and well-nourished. She is active.  Non-toxic appearance. She does not have a sickly appearance. She does not appear ill. No distress.  HENT:  Head: Normocephalic and atraumatic.  Right Ear: Hearing, external ear and ear canal normal. No swelling or tenderness. Tympanic membrane is retracted (mildly ). Tympanic membrane is not perforated and not erythematous.  Left Ear: External ear normal. No tenderness. Tympanic membrane is not erythematous and not retracted.  Nose: Nose normal.  Mouth/Throat: Uvula is midline, oropharynx is clear and moist and mucous membranes are normal. No oropharyngeal exudate, posterior oropharyngeal edema or posterior oropharyngeal erythema.  Bilateral tonsils 2 + she reports she usually has large tonsils. No exudate noted no significant erythema.   Eyes: Conjunctivae and EOM are normal. Pupils are equal, round, and reactive to light. Right eye exhibits no discharge. Left eye exhibits no discharge. No scleral icterus.  Neck: Trachea normal, normal range of motion, full passive range of motion without pain and phonation normal. Neck supple. No JVD present. No tracheal tenderness present. No tracheal deviation present.  Cardiovascular: Normal rate, regular rhythm, normal heart sounds and intact distal pulses. Exam reveals no gallop and no friction rub.  No murmur heard. Pulmonary/Chest: Effort normal. No stridor. No respiratory distress. She has wheezes (bronchial congestion/ mild expiratory wheeze upper left and right with  stethocope only  - duoneb ordered in office - ) in the right upper field and the right lower field. She has no rales. She exhibits no tenderness.  Bronchial intermittent cough while provider in room, no audible wheezing by ear, no respiratory distress  After duo neb given by Armando Gang RN in  office no wheezing auscultated in any lobe bilaterally. Cough decreased to very occasional in room   Abdominal: Soft. Bowel sounds are normal. She exhibits no distension.  Musculoskeletal: Normal range of motion.  Patient moves on and off of exam table and in room without difficulty. Gait is normal in hall and in room. Patient is oriented to person place time and situation. Patient answers questions appropriately and engages in conversation.  Lymphadenopathy:    She has no cervical adenopathy.  Neurological: She is alert and oriented to person, place, and time. She has normal strength. She displays normal reflexes. No cranial nerve deficit. She exhibits normal muscle tone. Coordination normal.  Skin: Skin is warm and dry. No rash noted. She is not diaphoretic. No erythema. No pallor.  Psychiatric: She has a normal mood and affect. Her speech is normal and behavior is normal. Judgment and thought content normal. Cognition and memory are normal.  Vitals reviewed.       Assessment:     Bronchitis  Wheeze - Plan: ipratropium-albuterol (DUONEB) 0.5-2.5 (3) MG/3ML nebulizer solution 3 mL  Cough  Post-nasal drip      Plan:    Start Mucinex over the counter for at least 3 to 4 days- drink plenty of clear liquids.   Meds ordered this encounter  Medications  . ipratropium-albuterol (DUONEB) 0.5-2.5 (3) MG/3ML nebulizer solution 3 mL  . azithromycin (ZITHROMAX) 250 MG tablet    Sig: By mouth Take 2 tablets ttoday and then one tablet days 2, 3 , 4 and 5.    Dispense:  6 tablet    Refill:  0  . predniSONE (STERAPRED UNI-PAK 21 TAB) 10 MG (21) TBPK tablet    Sig: By mouth Take 6 tablets on day 1, Take 5 tablets day 2 Take 4 tablets day 3 Take 3 tablets day 4 Take 2 tablets day five 5 Take 1 tablet day    Dispense:  21 tablet    Refill:  0  . fluticasone (FLONASE) 50 MCG/ACT nasal spray    Sig: Place 2 sprays into both nostrils daily.    Dispense:  16 g    Refill:  0  . albuterol  (PROVENTIL HFA;VENTOLIN HFA) 108 (90 Base) MCG/ACT inhaler    Sig: Inhale 2 puffs into the lungs every 6 (six) hours as needed for wheezing or shortness of breath.    Dispense:  1 Inhaler    Refill:  0   AVS Education sheet reviewed and provided to patient.  Advised to return to clinic for an appointment if no improvement within 72 hours or if any symptoms change or worsen. Advised ER or urgent Care if after hours or on weekend. 911 for emergency symptoms at any time.   Patient verbalized understanding of all instructions given and denies any further questions at this time.

## 2017-11-07 NOTE — Patient Instructions (Addendum)
Azithromycin tablets What is this medicine? AZITHROMYCIN (az ith roe MYE sin) is a macrolide antibiotic. It is used to treat or prevent certain kinds of bacterial infections. It will not work for colds, flu, or other viral infections. This medicine may be used for other purposes; ask your health care provider or pharmacist if you have questions. COMMON BRAND NAME(S): Zithromax, Zithromax Tri-Pak, Zithromax Z-Pak What should I tell my health care provider before I take this medicine? They need to know if you have any of these conditions: -kidney disease -liver disease -irregular heartbeat or heart disease -an unusual or allergic reaction to azithromycin, erythromycin, other macrolide antibiotics, foods, dyes, or preservatives -pregnant or trying to get pregnant -breast-feeding How should I use this medicine? Take this medicine by mouth with a full glass of water. Follow the directions on the prescription label. The tablets can be taken with food or on an empty stomach. If the medicine upsets your stomach, take it with food. Take your medicine at regular intervals. Do not take your medicine more often than directed. Take all of your medicine as directed even if you think your are better. Do not skip doses or stop your medicine early. Talk to your pediatrician regarding the use of this medicine in children. While this drug may be prescribed for children as young as 6 months for selected conditions, precautions do apply. Overdosage: If you think you have taken too much of this medicine contact a poison control center or emergency room at once. NOTE: This medicine is only for you. Do not share this medicine with others. What if I miss a dose? If you miss a dose, take it as soon as you can. If it is almost time for your next dose, take only that dose. Do not take double or extra doses. What may interact with this medicine? Do not take this medicine with any of the following  medications: -lincomycin This medicine may also interact with the following medications: -amiodarone -antacids -birth control pills -cyclosporine -digoxin -magnesium -nelfinavir -phenytoin -warfarin This list may not describe all possible interactions. Give your health care provider a list of all the medicines, herbs, non-prescription drugs, or dietary supplements you use. Also tell them if you smoke, drink alcohol, or use illegal drugs. Some items may interact with your medicine. What should I watch for while using this medicine? Tell your doctor or healthcare professional if your symptoms do not start to get better or if they get worse. Do not treat diarrhea with over the counter products. Contact your doctor if you have diarrhea that lasts more than 2 days or if it is severe and watery. This medicine can make you more sensitive to the sun. Keep out of the sun. If you cannot avoid being in the sun, wear protective clothing and use sunscreen. Do not use sun lamps or tanning beds/booths. What side effects may I notice from receiving this medicine? Side effects that you should report to your doctor or health care professional as soon as possible: -allergic reactions like skin rash, itching or hives, swelling of the face, lips, or tongue -confusion, nightmares or hallucinations -dark urine -difficulty breathing -hearing loss -irregular heartbeat or chest pain -pain or difficulty passing urine -redness, blistering, peeling or loosening of the skin, including inside the mouth -white patches or sores in the mouth -yellowing of the eyes or skin Side effects that usually do not require medical attention (report to your doctor or health care professional if they continue or are bothersome): -  diarrhea -dizziness, drowsiness -headache -stomach upset or vomiting -tooth discoloration -vaginal irritation This list may not describe all possible side effects. Call your doctor for medical advice  about side effects. You may report side effects to FDA at 1-800-FDA-1088. Where should I keep my medicine? Keep out of the reach of children. Store at room temperature between 15 and 30 degrees C (59 and 86 degrees F). Throw away any unused medicine after the expiration date. NOTE: This sheet is a summary. It may not cover all possible information. If you have questions about this medicine, talk to your doctor, pharmacist, or health care provider.  2018 Elsevier/[employer] Standard (2015-11-30 15:26:03) Fluticasone nasal spray What is this medicine? FLUTICASONE (floo TIK a sone) is a corticosteroid. This medicine is used to treat the symptoms of allergies like sneezing, itchy red eyes, and itchy, runny, or stuffy nose. This medicine is also used to treat nasal polyps. This medicine may be used for other purposes; ask your health care provider or pharmacist if you have questions. COMMON BRAND NAME(S): Flonase, Flonase Allergy Relief, Flonase Sensimist, Veramyst, XHANCE What should I tell my health care provider before I take this medicine? They need to know if you have any of these conditions: -cataracts -glaucoma -infection, like tuberculosis, herpes, or fungal infection -recent surgery on nose or sinuses -taking a corticosteroid by mouth -an unusual or allergic reaction to fluticasone, steroids, other medicines, foods, dyes, or preservatives -pregnant or trying to get pregnant -breast-feeding How should I use this medicine? This medicine is for use in the nose. Follow the directions on your product or prescription label. This medicine works best if used at regular intervals. Do not use more often than directed. Make sure that you are using your nasal spray correctly. After 6 months of daily use for allergies, talk to your doctor or health care professional before using it for a longer time. Ask your doctor or health care professional if you have any questions. Talk to your pediatrician regarding  the use of this medicine in children. Special care may be needed. Some products have been used for allergies in children as young as 2 years. After 2 months of daily use without a prescription in a child, talk to your pediatrician before using it for a longer time. Use of this medicine for nasal polyps is not approved in children. Overdosage: If you think you have taken too much of this medicine contact a poison control center or emergency room at once. NOTE: This medicine is only for you. Do not share this medicine with others. What if I miss a dose? If you miss a dose, use it as soon as you remember. If it is almost time for your next dose, use only that dose and continue with your regular schedule. Do not use double or extra doses. What may interact with this medicine? -certain antibiotics like clarithromycin and telithromycin -certain medicines for fungal infections like ketoconazole, itraconazole, and voriconazole -conivaptan -nefazodone -some medicines for HIV -vaccines This list may not describe all possible interactions. Give your health care provider a list of all the medicines, herbs, non-prescription drugs, or dietary supplements you use. Also tell them if you smoke, drink alcohol, or use illegal drugs. Some items may interact with your medicine. What should I watch for while using this medicine? Visit your doctor or health care professional for regular checks on your progress. Some symptoms may improve within 12 hours after starting use. Check with your doctor or health care professional if there  is no improvement in your symptoms after 3 weeks of use. This medicine may increase your risk of getting an infection. Tell your doctor or health care professional if you are around anyone with measles or chickenpox, or if you develop sores or blisters that do not heal properly. What side effects may I notice from receiving this medicine? Side effects that you should report to your doctor or  health care professional as soon as possible: -allergic reactions like skin rash, itching or hives, swelling of the face, lips, or tongue -changes in vision -crusting or sores in the nose -nosebleed -signs and symptoms of infection like fever or chills; cough; sore throat -white patches or sores in the mouth or nose Side effects that usually do not require medical attention (report to your doctor or health care professional if they continue or are bothersome): -burning or irritation inside the nose or throat -cough -headache -unusual taste or smell This list may not describe all possible side effects. Call your doctor for medical advice about side effects. You may report side effects to FDA at 1-800-FDA-1088. Where should I keep my medicine? Keep out of the reach of children. Store at room temperature between 15 and 30 degrees C (59 and 86 degrees F). Avoid exposure to extreme heat, cold, or light. Throw away any unused medicine after the expiration date. NOTE: This sheet is a summary. It may not cover all possible information. If you have questions about this medicine, talk to your doctor, pharmacist, or health care provider.  2018 Elsevier/Gold Standard (2016-07-14 14:23:12) Prednisone tablets What is this medicine? PREDNISONE (PRED ni sone) is a corticosteroid. It is commonly used to treat inflammation of the skin, joints, lungs, and other organs. Common conditions treated include asthma, allergies, and arthritis. It is also used for other conditions, such as blood disorders and diseases of the adrenal glands. This medicine may be used for other purposes; ask your health care provider or pharmacist if you have questions. COMMON BRAND NAME(S): Deltasone, Predone, Sterapred, Sterapred DS What should I tell my health care provider before I take this medicine? They need to know if you have any of these conditions: -Cushing's syndrome -diabetes -glaucoma -heart disease -high blood  pressure -infection (especially a virus infection such as chickenpox, cold sores, or herpes) -kidney disease -liver disease -mental illness -myasthenia gravis -osteoporosis -seizures -stomach or intestine problems -thyroid disease -an unusual or allergic reaction to lactose, prednisone, other medicines, foods, dyes, or preservatives -pregnant or trying to get pregnant -breast-feeding How should I use this medicine? Take this medicine by mouth with a glass of water. Follow the directions on the prescription label. Take this medicine with food. If you are taking this medicine once a day, take it in the morning. Do not take more medicine than you are told to take. Do not suddenly stop taking your medicine because you may develop a severe reaction. Your doctor will tell you how much medicine to take. If your doctor wants you to stop the medicine, the dose may be slowly lowered over time to avoid any side effects. Talk to your pediatrician regarding the use of this medicine in children. Special care may be needed. Overdosage: If you think you have taken too much of this medicine contact a poison control center or emergency room at once. NOTE: This medicine is only for you. Do not share this medicine with others. What if I miss a dose? If you miss a dose, take it as soon as you can.  If it is almost time for your next dose, talk to your doctor or health care professional. You may need to miss a dose or take an extra dose. Do not take double or extra doses without advice. What may interact with this medicine? Do not take this medicine with any of the following medications: -metyrapone -mifepristone This medicine may also interact with the following medications: -aminoglutethimide -amphotericin B -aspirin and aspirin-like medicines -barbiturates -certain medicines for diabetes, like glipizide or glyburide -cholestyramine -cholinesterase  inhibitors -cyclosporine -digoxin -diuretics -ephedrine -female hormones, like estrogens and birth control pills -isoniazid -ketoconazole -NSAIDS, medicines for pain and inflammation, like ibuprofen or naproxen -phenytoin -rifampin -toxoids -vaccines -warfarin This list may not describe all possible interactions. Give your health care provider a list of all the medicines, herbs, non-prescription drugs, or dietary supplements you use. Also tell them if you smoke, drink alcohol, or use illegal drugs. Some items may interact with your medicine. What should I watch for while using this medicine? Visit your doctor or health care professional for regular checks on your progress. If you are taking this medicine over a prolonged period, carry an identification card with your name and address, the type and dose of your medicine, and your doctor's name and address. This medicine may increase your risk of getting an infection. Tell your doctor or health care professional if you are around anyone with measles or chickenpox, or if you develop sores or blisters that do not heal properly. If you are going to have surgery, tell your doctor or health care professional that you have taken this medicine within the last twelve months. Ask your doctor or health care professional about your diet. You may need to lower the amount of salt you eat. This medicine may affect blood sugar levels. If you have diabetes, check with your doctor or health care professional before you change your diet or the dose of your diabetic medicine. What side effects may I notice from receiving this medicine? Side effects that you should report to your doctor or health care professional as soon as possible: -allergic reactions like skin rash, itching or hives, swelling of the face, lips, or tongue -changes in emotions or moods -changes in vision -depressed mood -eye pain -fever or chills, cough, sore throat, pain or difficulty  passing urine -increased thirst -swelling of ankles, feet Side effects that usually do not require medical attention (report to your doctor or health care professional if they continue or are bothersome): -confusion, excitement, restlessness -headache -nausea, vomiting -skin problems, acne, thin and shiny skin -trouble sleeping -weight gain This list may not describe all possible side effects. Call your doctor for medical advice about side effects. You may report side effects to FDA at 1-800-FDA-1088. Where should I keep my medicine? Keep out of the reach of children. Store at room temperature between 15 and 30 degrees C (59 and 86 degrees F). Protect from light. Keep container tightly closed. Throw away any unused medicine after the expiration date. NOTE: This sheet is a summary. It may not cover all possible information. If you have questions about this medicine, talk to your doctor, pharmacist, or health care provider.  2018 Elsevier/Gold Standard (2011-05-18 10:57:14) Acute Bronchitis, Adult Acute bronchitis is when air tubes (bronchi) in the lungs suddenly get swollen. The condition can make it hard to breathe. It can also cause these symptoms:  A cough.  Coughing up clear, yellow, or green mucus.  Wheezing.  Chest congestion.  Shortness of breath.  A  fever.  Body aches.  Chills.  A sore throat.  Follow these instructions at home: Medicines  Take over-the-counter and prescription medicines only as told by your doctor.  If you were prescribed an antibiotic medicine, take it as told by your doctor. Do not stop taking the antibiotic even if you start to feel better. General instructions  Rest.  Drink enough fluids to keep your pee (urine) clear or pale yellow.  Avoid smoking and secondhand smoke. If you smoke and you need help quitting, ask your doctor. Quitting will help your lungs heal faster.  Use an inhaler, cool mist vaporizer, or humidifier as told by your  doctor.  Keep all follow-up visits as told by your doctor. This is important. How is this prevented? To lower your risk of getting this condition again:  Wash your hands often with soap and water. If you cannot use soap and water, use hand sanitizer.  Avoid contact with people who have cold symptoms.  Try not to touch your hands to your mouth, nose, or eyes.  Make sure to get the flu shot every year.  Contact a doctor if:  Your symptoms do not get better in 2 weeks. Get help right away if:  You cough up blood.  You have chest pain.  You have very bad shortness of breath.  You become dehydrated.  You faint (pass out) or keep feeling like you are going to pass out.  You keep throwing up (vomiting).  You have a very bad headache.  Your fever or chills gets worse. This information is not intended to replace advice given to you by your health care provider. Make sure you discuss any questions you have with your health care provider. Document Released: 03/20/2008 Document Revised: 05/10/2016 Document Reviewed: 03/22/2016 Elsevier Interactive Patient Education  2018 [employer]   Upper Respiratory Infection, Adult Most upper respiratory infections (URIs) are caused by a virus. A URI affects the nose, throat, and upper air passages. The most common type of URI is often called "the common cold." Follow these instructions at home:  Take medicines only as told by your doctor.  Gargle warm saltwater or take cough drops to comfort your throat as told by your doctor.  Use a warm mist humidifier or inhale steam from a shower to increase air moisture. This may make it easier to breathe.  Drink enough fluid to keep your pee (urine) clear or pale yellow.  Eat soups and other clear broths.  Have a healthy diet.  Rest as needed.  Go back to work when your fever is gone or your doctor says it is okay. ? You may need to stay home longer to avoid giving your URI to  others. ? You can also wear a face mask and wash your hands often to prevent spread of the virus.  Use your inhaler more if you have asthma.  Do not use any tobacco products, including cigarettes, chewing tobacco, or electronic cigarettes. If you need help quitting, ask your doctor. Contact a doctor if:  You are getting worse, not better.  Your symptoms are not helped by medicine.  You have chills.  You are getting more short of breath.  You have brown or red mucus.  You have yellow or brown discharge from your nose.  You have pain in your face, especially when you bend forward.  You have a fever.  You have puffy (swollen) neck glands.  You have pain while swallowing.  You have white areas  in the back of your throat. Get help right away if:  You have very bad or constant: ? Headache. ? Ear pain. ? Pain in your forehead, behind your eyes, and over your cheekbones (sinus pain). ? Chest pain.  You have long-lasting (chronic) lung disease and any of the following: ? Wheezing. ? Long-lasting cough. ? Coughing up blood. ? A change in your usual mucus.  You have a stiff neck.  You have changes in your: ? Vision. ? Hearing. ? Thinking. ? Mood. This information is not intended to replace advice given to you by your health care provider. Make sure you discuss any questions you have with your health care provider. Document Released: 03/20/2008 Document Revised: 06/04/2016 Document Reviewed: 01/07/2014 Elsevier Interactive Patient Education  2018 [employer].

## 2017-11-08 ENCOUNTER — Ambulatory Visit: Payer: Federal, State, Local not specified - PPO

## 2017-11-08 DIAGNOSIS — M5416 Radiculopathy, lumbar region: Secondary | ICD-10-CM | POA: Diagnosis not present

## 2017-11-08 DIAGNOSIS — R252 Cramp and spasm: Secondary | ICD-10-CM

## 2017-11-08 DIAGNOSIS — R293 Abnormal posture: Secondary | ICD-10-CM | POA: Diagnosis not present
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
## 2017-11-12 ENCOUNTER — Ambulatory Visit: Payer: Federal, State, Local not specified - PPO

## 2017-11-12 DIAGNOSIS — R293 Abnormal posture: Secondary | ICD-10-CM

## 2017-11-12 DIAGNOSIS — R252 Cramp and spasm: Secondary | ICD-10-CM | POA: Diagnosis not present

## 2017-11-12 DIAGNOSIS — M5416 Radiculopathy, lumbar region: Secondary | ICD-10-CM | POA: Diagnosis not present
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
## 2017-11-15 ENCOUNTER — Ambulatory Visit: Payer: Federal, State, Local not specified - PPO

## 2017-11-28 ENCOUNTER — Ambulatory Visit: Payer: Federal, State, Local not specified - PPO | Attending: Medical

## 2017-11-28 DIAGNOSIS — R293 Abnormal posture: Secondary | ICD-10-CM | POA: Diagnosis not present

## 2017-11-28 DIAGNOSIS — M5416 Radiculopathy, lumbar region: Secondary | ICD-10-CM | POA: Insufficient documentation

## 2017-11-28 DIAGNOSIS — R252 Cramp and spasm: Secondary | ICD-10-CM
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
## 2017-12-03 ENCOUNTER — Ambulatory Visit: Payer: Federal, State, Local not specified - PPO

## 2017-12-03 DIAGNOSIS — R252 Cramp and spasm: Secondary | ICD-10-CM | POA: Diagnosis not present

## 2017-12-03 DIAGNOSIS — M5416 Radiculopathy, lumbar region: Secondary | ICD-10-CM | POA: Diagnosis not present

## 2017-12-03 DIAGNOSIS — R293 Abnormal posture: Secondary | ICD-10-CM
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
## 2017-12-05 ENCOUNTER — Ambulatory Visit: Payer: Federal, State, Local not specified - PPO

## 2017-12-05 DIAGNOSIS — R252 Cramp and spasm: Secondary | ICD-10-CM | POA: Diagnosis not present

## 2017-12-05 DIAGNOSIS — R293 Abnormal posture: Secondary | ICD-10-CM | POA: Diagnosis not present

## 2017-12-05 DIAGNOSIS — M5416 Radiculopathy, lumbar region: Secondary | ICD-10-CM
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
## 2017-12-10 ENCOUNTER — Ambulatory Visit: Payer: Federal, State, Local not specified - PPO | Admitting: Physical Therapy

## 2017-12-10 DIAGNOSIS — R252 Cramp and spasm: Secondary | ICD-10-CM

## 2017-12-10 DIAGNOSIS — R293 Abnormal posture: Secondary | ICD-10-CM | POA: Diagnosis not present

## 2017-12-10 DIAGNOSIS — M5416 Radiculopathy, lumbar region: Secondary | ICD-10-CM | POA: Diagnosis not present
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
## 2017-12-12 ENCOUNTER — Ambulatory Visit: Payer: Federal, State, Local not specified - PPO

## 2017-12-12 DIAGNOSIS — R293 Abnormal posture: Secondary | ICD-10-CM

## 2017-12-12 DIAGNOSIS — M5416 Radiculopathy, lumbar region: Secondary | ICD-10-CM | POA: Diagnosis not present

## 2017-12-12 DIAGNOSIS — R252 Cramp and spasm: Secondary | ICD-10-CM
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
## 2017-12-17 ENCOUNTER — Ambulatory Visit: Payer: Federal, State, Local not specified - PPO | Attending: Medical

## 2017-12-17 DIAGNOSIS — M5416 Radiculopathy, lumbar region: Secondary | ICD-10-CM

## 2017-12-17 DIAGNOSIS — R252 Cramp and spasm: Secondary | ICD-10-CM | POA: Insufficient documentation

## 2017-12-17 DIAGNOSIS — R293 Abnormal posture: Secondary | ICD-10-CM
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
## 2017-12-19 ENCOUNTER — Ambulatory Visit: Payer: Federal, State, Local not specified - PPO

## 2017-12-19 DIAGNOSIS — R293 Abnormal posture: Secondary | ICD-10-CM | POA: Diagnosis not present

## 2017-12-19 DIAGNOSIS — R252 Cramp and spasm: Secondary | ICD-10-CM

## 2017-12-19 DIAGNOSIS — M5416 Radiculopathy, lumbar region: Secondary | ICD-10-CM | POA: Diagnosis not present
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
## 2017-12-25 DIAGNOSIS — K080 Exfoliation of teeth due to systemic causes: Secondary | ICD-10-CM | POA: Diagnosis not present

## 2017-12-27 ENCOUNTER — Ambulatory Visit: Payer: Federal, State, Local not specified - PPO

## 2017-12-27 DIAGNOSIS — M5416 Radiculopathy, lumbar region: Secondary | ICD-10-CM

## 2017-12-27 DIAGNOSIS — R252 Cramp and spasm: Secondary | ICD-10-CM | POA: Diagnosis not present

## 2017-12-27 DIAGNOSIS — R293 Abnormal posture: Secondary | ICD-10-CM | POA: Diagnosis not present
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
## 2017-12-27 NOTE — Addendum Note (Signed)
Addended by: Bethanie DickerISSELL, [place] V on: 12/27/2017 11:37 AM   Modules accepted: Orders

## 2018-01-10 ENCOUNTER — Ambulatory Visit: Payer: Federal, State, Local not specified - PPO

## 2018-01-10 DIAGNOSIS — M5416 Radiculopathy, lumbar region: Secondary | ICD-10-CM

## 2018-01-10 DIAGNOSIS — R252 Cramp and spasm: Secondary | ICD-10-CM

## 2018-01-10 NOTE — Therapy (Signed)
[facility] [facility] [address] [place], [place], [address] Phone: [phone]   Fax:  [phone]  Patient Details  Name: Peggy Garcia MRN: [id number] Date of Birth: 06/17/1986 Referring Provider:  Pleas Kochatcliffe, Heather R, P*  Encounter Date: 01/10/2018   PT/OT/SLP Screening Form   Time: in: 16:08    Time out: 16:38    Complaint LBP Past Medical Hx:  LBP Injury Date:  Pain Scale: 0/10 Patient's phone number:   Hx (this occurrence):  Was being seen for physical therapy; just wanted an update to HEP to continue benefits of therapy.     Assessment: Performing final HEP assessment:  Added Exercises to HEP Mini Squats singles -- 2 x 10 Lunges in standing -- x 12 Hip abduction/ext in standing with YTB -- x 10  Hip circles with YTB -- x 60min x 2    Recommendations:    Comments: Continue HEP   []  Patient would benefit from an MD referral []  Patient would benefit from a full PT/OT/ SLP evaluation and treatment. [x]  No intervention recommended at this time.    Myrene GalasWesley Ahnya Akre, PT DPT 01/10/2018, 4:13 PM  [place] [facility] [address] [place], [place], [address] Phone: [phone]   Fax:  [phone]

## 2018-01-16 DIAGNOSIS — K080 Exfoliation of teeth due to systemic causes: Secondary | ICD-10-CM | POA: Diagnosis not present

## 2018-02-06 ENCOUNTER — Ambulatory Visit (INDEPENDENT_AMBULATORY_CARE_PROVIDER_SITE_OTHER): Payer: Federal, State, Local not specified - PPO | Admitting: Obstetrics and Gynecology

## 2018-02-06 ENCOUNTER — Encounter: Payer: Self-pay | Admitting: Obstetrics and Gynecology

## 2018-02-06 VITALS — BP 120/80 | Ht 62.0 in | Wt 207.0 lb

## 2018-02-06 DIAGNOSIS — Z1151 Encounter for screening for human papillomavirus (HPV): Secondary | ICD-10-CM

## 2018-02-06 DIAGNOSIS — B356 Tinea cruris: Secondary | ICD-10-CM | POA: Diagnosis not present

## 2018-02-06 DIAGNOSIS — Z3046 Encounter for surveillance of implantable subdermal contraceptive: Secondary | ICD-10-CM

## 2018-02-06 DIAGNOSIS — Z1231 Encounter for screening mammogram for malignant neoplasm of breast: Secondary | ICD-10-CM

## 2018-02-06 DIAGNOSIS — Z01419 Encounter for gynecological examination (general) (routine) without abnormal findings: Secondary | ICD-10-CM

## 2018-02-06 DIAGNOSIS — Z124 Encounter for screening for malignant neoplasm of cervix: Secondary | ICD-10-CM | POA: Diagnosis not present

## 2018-02-06 DIAGNOSIS — Z803 Family history of malignant neoplasm of breast: Secondary | ICD-10-CM

## 2018-02-06 DIAGNOSIS — Z1239 Encounter for other screening for malignant neoplasm of breast: Secondary | ICD-10-CM

## 2018-02-06 NOTE — Progress Notes (Addendum)
PCP:  Patient, No Pcp Per   Chief Complaint  Patient presents with  . Gynecologic Exam     HPI:      [name] is a 32 y.o. [phone] who LMP was No LMP recorded. Patient has had an implant., presents today for her annual examination.  Her menses are regular every 28-30 days, lasting 3 days.  Dysmenorrhea none. She does not have intermenstrual bleeding.  Sex activity: single partner, contraception - Nexplanon inserted 01/26/17. 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 There is a FH of breast cancer in her mom, genetic testing not done. There is no FH of ovarian cancer. The patient does do self-breast exams.  Tobacco use: The patient denies current or previous tobacco use. Alcohol use: social drinker No drug use.  Exercise: not active  She does get adequate calcium but not Vitamin D in her diet.  Has had itchy rash in bilat ing areas for the past few wks. No meds to treat. Rash is brown and pt concerned.   Past Medical History:  Diagnosis Date  . Abnormal Pap smear of cervix     Past Surgical History:  Procedure Laterality Date  .  2 c-sections  2014 , 2018  . CHOLECYSTECTOMY    . COLPOSCOPY  2006/2007  . WISDOM TOOTH EXTRACTION Bilateral 2008   upper and lower    Family History  Problem Relation Age of Onset  . Breast cancer Mother 2338  . Heart failure Father   . Stroke Father   . Heart attack Father   . Heart disease Maternal Uncle   . Stroke Maternal Grandmother   . Deep vein thrombosis Paternal Grandmother     Social History   Socioeconomic History  . Marital status: Married    Spouse name: Not on file  . Number of children: Not on file  . Years of education: Not on file  . Highest education level: Not on file  Occupational History  . Not on file  Social Needs  . Financial resource strain: Not on file  . Food insecurity:    Worry: Not on file    Inability: Not  on file  . Transportation needs:    Medical: Not on file    Non-medical: Not on file  Tobacco Use  . Smoking status: Never Smoker  . Smokeless tobacco: Never Used  Substance and Sexual Activity  . Alcohol use: Yes    Frequency: Never  . Drug use: No  . Sexual activity: Yes    Birth control/protection: Implant  Lifestyle  . Physical activity:    Days per week: Not on file    Minutes per session: Not on file  . Stress: Not on file  Relationships  . Social connections:    Talks on phone: Not on file    Gets together: Not on file    Attends religious service: Not on file    Active member of club or organization: Not on file    Attends meetings of clubs or organizations: Not on file    Relationship status: Not on file  . Intimate partner violence:    Fear of current or ex partner: Not on file    Emotionally abused: Not on file    Physically abused: Not on file    Forced sexual activity: Not on file  Other Topics Concern  . Not on file  Social History Narrative  . Not on file    Outpatient Medications Prior to Visit  Medication Sig Dispense Refill  . albuterol (PROVENTIL HFA;VENTOLIN HFA) 108 (90 Base) MCG/ACT inhaler Inhale 2 puffs into the lungs every 6 (six) hours as needed for wheezing or shortness of breath. 1 Inhaler 0  . etonogestrel (NEXPLANON) 68 MG IMPL implant 1 each once by Subdermal route.    . fluticasone (FLONASE) 50 MCG/ACT nasal spray Place 2 sprays into both nostrils daily. 16 g 0  . azithromycin (ZITHROMAX) 250 MG tablet By mouth Take 2 tablets ttoday and then one tablet days 2, 3 , 4 and 5. (Patient not taking: Reported on 02/06/2018) 6 tablet 0  . predniSONE (STERAPRED UNI-PAK 21 TAB) 10 MG (21) TBPK tablet By mouth Take 6 tablets on day 1, Take 5 tablets day 2 Take 4 tablets day 3 Take 3 tablets day 4 Take 2 tablets day five 5 Take 1 tablet day (Patient not taking: Reported on 02/06/2018) 21 tablet 0   No facility-administered medications prior to visit.        ROS:  Review of Systems  Constitutional: Negative for fatigue, fever and unexpected weight change.  Respiratory: Negative for cough, shortness of breath and wheezing.   Cardiovascular: Negative for chest pain, palpitations and leg swelling.  Gastrointestinal: Negative for blood in stool, constipation, diarrhea, nausea and vomiting.  Endocrine: Negative for cold intolerance, heat intolerance and polyuria.  Genitourinary: Negative for dyspareunia, dysuria, flank pain, frequency, genital sores, hematuria, menstrual problem, pelvic pain, urgency, vaginal bleeding, vaginal discharge and vaginal pain.  Musculoskeletal: Negative for back pain, joint swelling and myalgias.  Skin: Negative for rash.  Neurological: Negative for dizziness, syncope, light-headedness, numbness and headaches.  Hematological: Negative for adenopathy.  Psychiatric/Behavioral: Negative for agitation, confusion, sleep disturbance and suicidal ideas. The patient is not nervous/anxious.    BREAST: No symptoms   Objective: BP 120/80   Ht 5\' 2"  (1.575 m)   Wt 207 lb (93.9 kg)   BMI 37.86 kg/m    Physical Exam  Constitutional: She is oriented to person, place, and time. She appears well-developed and well-nourished.  Genitourinary: Vagina normal and uterus normal. There is no rash or tenderness on the right labia. There is no rash or tenderness on the left labia. No erythema or tenderness in the vagina. No vaginal discharge found. Right adnexum does not display mass and does not display tenderness. Left adnexum does not display mass and does not display tenderness. Cervix does not exhibit motion tenderness or polyp. Uterus is not enlarged or tender.  Neck: Normal range of motion. No thyromegaly present.  Cardiovascular: Normal rate, regular rhythm and normal heart sounds.  No murmur heard. Pulmonary/Chest: Effort normal and breath sounds normal. Right breast exhibits no mass, no nipple discharge, no skin change  and no tenderness. Left breast exhibits no mass, no nipple discharge, no skin change and no tenderness.  Abdominal: Soft. There is no tenderness. There is no guarding.  Musculoskeletal: Normal range of motion.  Neurological: She is alert and oriented to person, place, and time. No cranial nerve deficit.  Psychiatric: She has a normal mood and affect. Her behavior is normal.  Vitals reviewed.   Assessment/Plan: Encounter for annual routine gynecological examination  Cervical cancer screening - Plan: IGP, Aptima HPV  Screening for HPV (human papillomavirus) - Plan: IGP, Aptima HPV  Encounter for surveillance of implantable subdermal contraceptive - Due for removal 01/2020.  Screening for breast cancer - Pt to sched  mammo. - Plan: MM 3D SCREEN BREAST BILATERAL  Family history of breast cancer - MyRisk testing discussed and pt declines. Handout given. F/u prn. Start yearly mammos. Add Vit D3 5000 IU daily - Plan: MM 3D SCREEN BREAST BILATERAL  Tinea cruris - OTC clotrimazole crm BID for 2-4 wks. Keep area dry. F/u prn.         GYN counsel breast self exam, mammography screening, adequate intake of calcium and vitamin D, diet and exercise     F/U  Return in about 1 year (around 02/07/2019).  Alicia B. Copland, PA-C 02/06/2018 9:52 AM

## 2018-02-06 NOTE — Patient Instructions (Signed)
I value your feedback and entrusting us with your care. If you get a [facility] patient survey, I would appreciate you taking the time to let us know about your experience today. Thank you! 

## 2018-02-09 LAB — IGP, APTIMA HPV
HPV Aptima: NEGATIVE
PAP Smear Comment: 0

## 2018-07-04 DIAGNOSIS — K080 Exfoliation of teeth due to systemic causes: Secondary | ICD-10-CM | POA: Diagnosis not present

## 2018-09-04 ENCOUNTER — Ambulatory Visit
Admission: RE | Admit: 2018-09-04 | Discharge: 2018-09-04 | Disposition: A | Discharge status: Home / Self Care | Payer: Federal, State, Local not specified - PPO | Source: Ambulatory Visit | Attending: Obstetrics and Gynecology | Admitting: Obstetrics and Gynecology

## 2018-09-04 DIAGNOSIS — Z1239 Encounter for other screening for malignant neoplasm of breast: Secondary | ICD-10-CM

## 2018-09-04 DIAGNOSIS — Z803 Family history of malignant neoplasm of breast: Secondary | ICD-10-CM | POA: Diagnosis not present

## 2018-09-04 DIAGNOSIS — Z1231 Encounter for screening mammogram for malignant neoplasm of breast: Secondary | ICD-10-CM | POA: Diagnosis not present

## 2018-09-11 ENCOUNTER — Encounter: Payer: Self-pay | Admitting: Obstetrics and Gynecology

## 2019-02-20 IMAGING — CR DG LUMBAR SPINE COMPLETE 4+V
1 series · 5 of 5 positions shown · non-contrast
Comparison: None.

CLINICAL DATA: Back pain

EXAM:
LUMBAR SPINE - COMPLETE 4+ VIEW

[Series 1: dg lumbar spine complete 4 +v · 0.14mm/px · 5 of 5 slices shown]
[im 1/5]
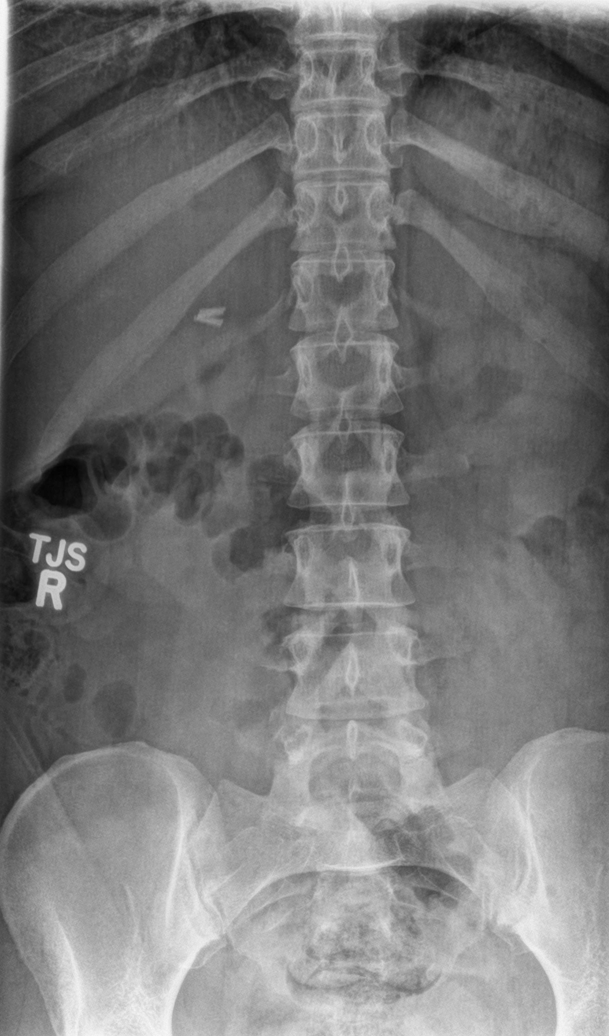
[im 2/5]
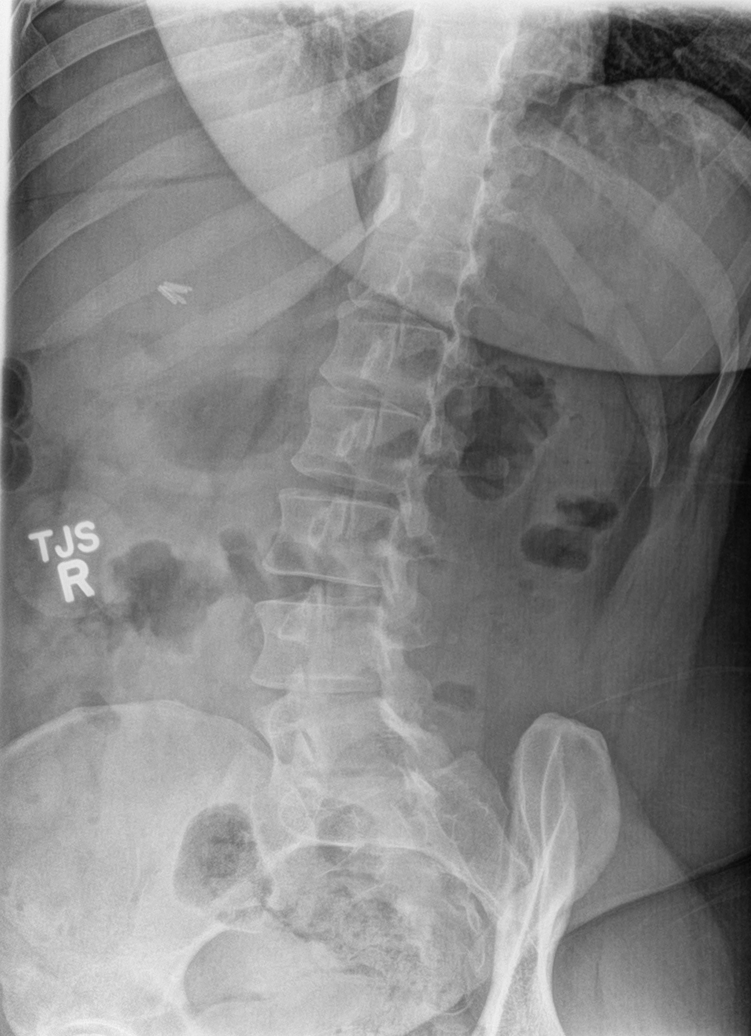
[im 3/5]
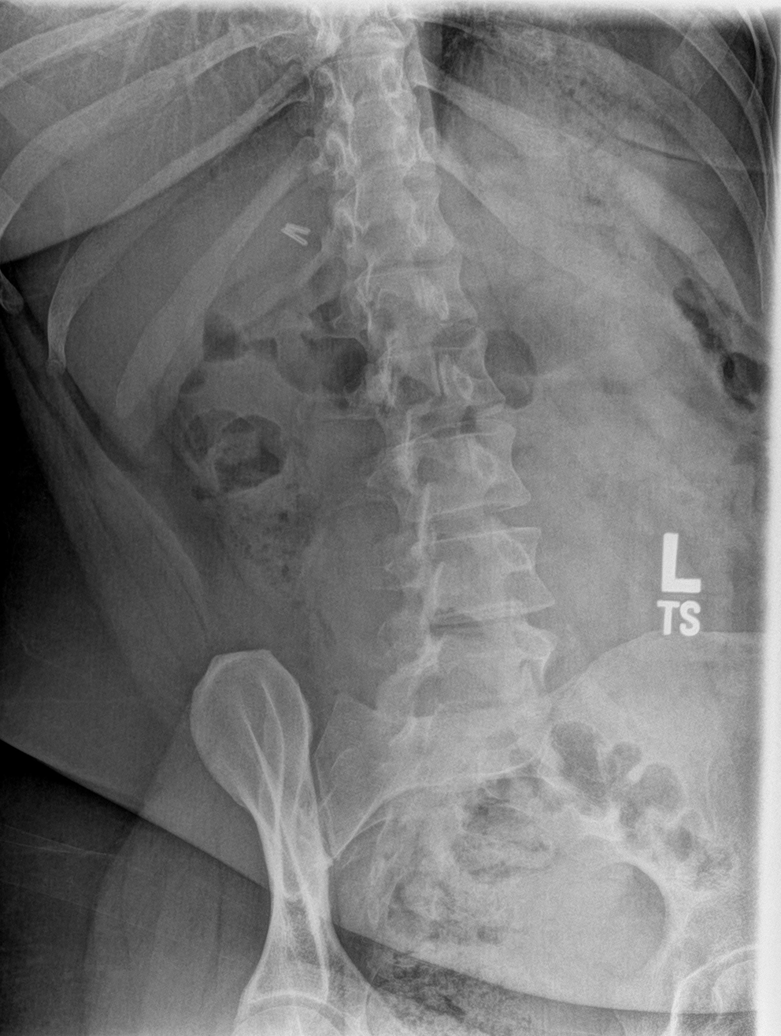
[im 4/5]
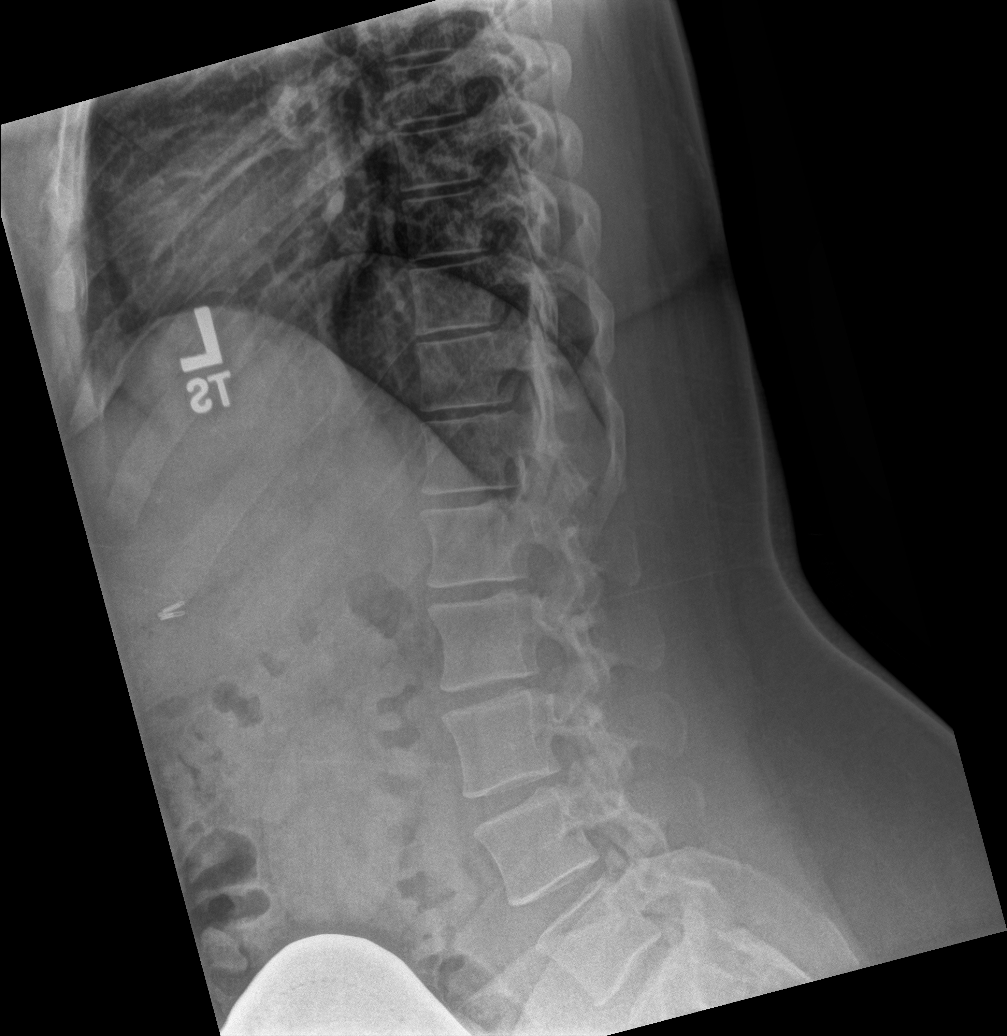
[im 5/5]
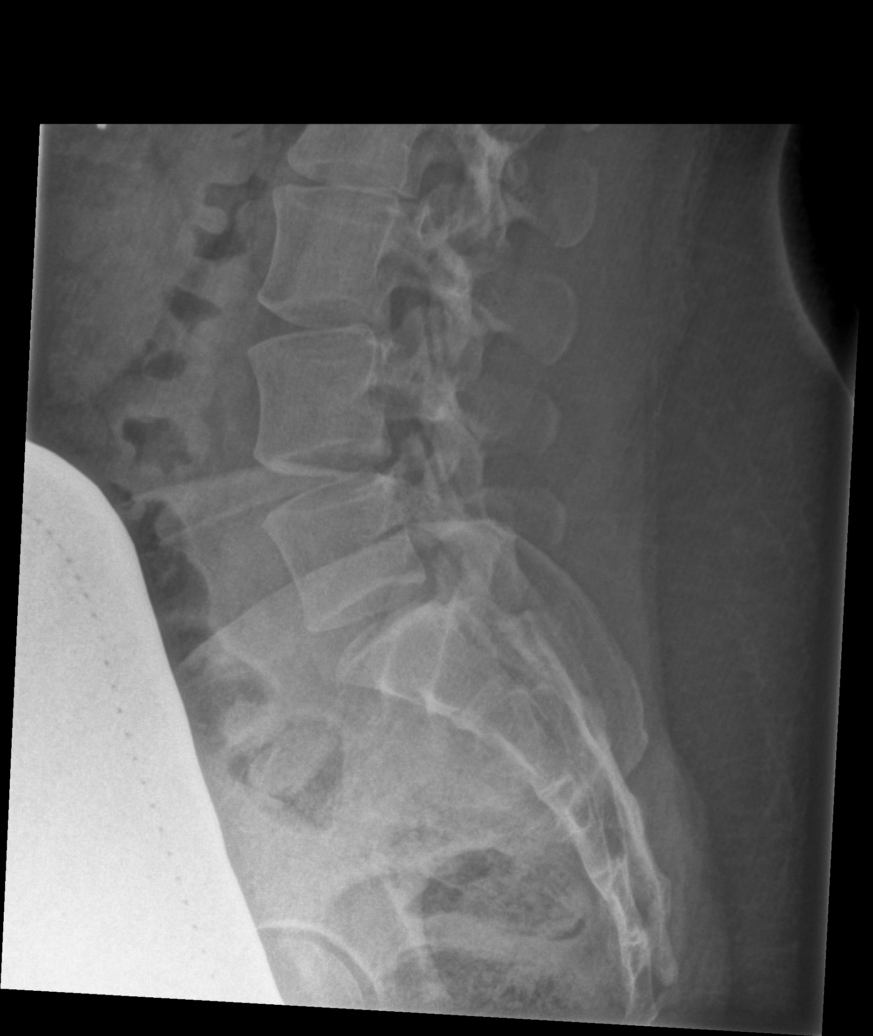

[5 of 5 positions shown; findings below may reference images not displayed]

FINDINGS: Surgical clips in the right upper quadrant.
IMPRESSION: Negative.

## 2019-06-09 DIAGNOSIS — Z20828 Contact with and (suspected) exposure to other viral communicable diseases: Secondary | ICD-10-CM | POA: Diagnosis not present

## 2019-06-10 DIAGNOSIS — Z20828 Contact with and (suspected) exposure to other viral communicable diseases: Secondary | ICD-10-CM | POA: Diagnosis not present

## 2019-12-10 ENCOUNTER — Telehealth: Payer: Self-pay | Admitting: Obstetrics and Gynecology

## 2019-12-10 NOTE — Telephone Encounter (Signed)
Noted. Will order to arrive by apt date/time. 

## 2019-12-10 NOTE — Telephone Encounter (Signed)
Patient is schedule for 01/05/20 at 1:30 for possible nexplanon replacement with ABC

## 2020-01-05 ENCOUNTER — Other Ambulatory Visit: Payer: Self-pay

## 2020-01-05 ENCOUNTER — Ambulatory Visit (INDEPENDENT_AMBULATORY_CARE_PROVIDER_SITE_OTHER): Payer: Federal, State, Local not specified - PPO | Admitting: Obstetrics and Gynecology

## 2020-01-05 ENCOUNTER — Encounter: Payer: Self-pay | Admitting: Obstetrics and Gynecology

## 2020-01-05 VITALS — BP 110/80 | Ht 62.0 in | Wt 222.0 lb

## 2020-01-05 DIAGNOSIS — Z3046 Encounter for surveillance of implantable subdermal contraceptive: Secondary | ICD-10-CM | POA: Diagnosis not present

## 2020-01-05 DIAGNOSIS — Z803 Family history of malignant neoplasm of breast: Secondary | ICD-10-CM

## 2020-01-05 DIAGNOSIS — Z1231 Encounter for screening mammogram for malignant neoplasm of breast: Secondary | ICD-10-CM

## 2020-01-05 DIAGNOSIS — Z01419 Encounter for gynecological examination (general) (routine) without abnormal findings: Secondary | ICD-10-CM

## 2020-01-05 MED ORDER — NEXPLANON 68 MG ~~LOC~~ IMPL: 1.0000 | DRUG_IMPLANT | Freq: Once | SUBCUTANEOUS | 0 refills | Status: DC

## 2020-01-05 NOTE — Progress Notes (Signed)
PCP:  Patient, No Pcp Per   Chief Complaint  Patient presents with  . Gynecologic Exam  . Contraception    nexplanon removal, not sure of new BC     HPI:      [name] is a 34 y.o. [id number] who LMP was No LMP recorded. Patient has had an implant., presents today for her annual examination.  Her menses are monthly spotting only for a few days. Had a real period 1/21.  Dysmenorrhea none. She does not have intermenstrual bleeding.  Sex activity: single partner, contraception - Nexplanon inserted 01/26/17. Would like a replacement. May want IUD in future. Did xulane and nuvaring in past. Doesn't want depo for wt gain. 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 There is a FH of breast cancer in her mom, genetic testing not done and declined by pt in past. Her sisters are gene neg. Pt interested in genetic testing this yr. There is no FH of ovarian cancer. The patient does do self-breast exams.  Tobacco use: The patient denies current or previous tobacco use. Alcohol use: social drinker No drug use.  Exercise: not active  She does not get adequate calcium or Vitamin D in her diet.   Past Medical History:  Diagnosis Date  . Abnormal Pap smear of cervix   . Family history of breast cancer     Past Surgical History:  Procedure Laterality Date  .  2 c-sections  2014 , 2018  . CHOLECYSTECTOMY    . COLPOSCOPY  2006/2007  . WISDOM TOOTH EXTRACTION Bilateral 2008   upper and lower    Family History  Problem Relation Age of Onset  . Breast cancer Mother 19  . Heart failure Father   . Stroke Father   . Heart attack Father   . Heart disease Maternal Uncle   . Stroke Maternal Grandmother   . Deep vein thrombosis Paternal Grandmother     Social History   Socioeconomic History  . Marital status: Married    Spouse name: Not on file  . Number of  children: Not on file  . Years of education: Not on file  . Highest education level: Not on file  Occupational History  . Not on file  Tobacco Use  . Smoking status: Never Smoker  . Smokeless tobacco: Never Used  Substance and Sexual Activity  . Alcohol use: Yes  . Drug use: No  . Sexual activity: Yes    Birth control/protection: Implant  Other Topics Concern  . Not on file  Social History Narrative  . Not on file   Social Determinants of Health   Financial Resource Strain:   . Difficulty of Paying Living Expenses:   Food Insecurity:   . Worried About Charity fundraiser in the Last Year:   . Arboriculturist in the Last Year:   Transportation Needs:   . Film/video editor (Medical):   Marland Kitchen Lack of Transportation (Non-Medical):   Physical Activity:   . Days of Exercise per Week:   . Minutes of Exercise per Session:   Stress:   . Feeling of Stress :   Social Connections:   . Frequency of Communication with Friends and Family:   . Frequency of Social Gatherings with Friends and Family:   . Attends Religious Services:   . Active Member of  Clubs or Organizations:   . Attends Archivist Meetings:   Marland Kitchen Marital Status:   Intimate Partner Violence:   . Fear of Current or Ex-Partner:   . Emotionally Abused:   Marland Kitchen Physically Abused:   . Sexually Abused:     Outpatient Medications Prior to Visit  Medication Sig Dispense Refill  . albuterol (PROVENTIL HFA;VENTOLIN HFA) 108 (90 Base) MCG/ACT inhaler Inhale 2 puffs into the lungs every 6 (six) hours as needed for wheezing or shortness of breath. 1 Inhaler 0  . azithromycin (ZITHROMAX) 250 MG tablet By mouth Take 2 tablets ttoday and then one tablet days 2, 3 , 4 and 5. (Patient not taking: Reported on 02/06/2018) 6 tablet 0  . etonogestrel (NEXPLANON) 68 MG IMPL implant 1 each once by Subdermal route.    . fluticasone (FLONASE) 50 MCG/ACT nasal spray Place 2 sprays into both nostrils daily. 16 g 0  . predniSONE (STERAPRED  UNI-PAK 21 TAB) 10 MG (21) TBPK tablet By mouth Take 6 tablets on day 1, Take 5 tablets day 2 Take 4 tablets day 3 Take 3 tablets day 4 Take 2 tablets day five 5 Take 1 tablet day (Patient not taking: Reported on 02/06/2018) 21 tablet 0   No facility-administered medications prior to visit.      ROS:  Review of Systems  Constitutional: Negative for fatigue, fever and unexpected weight change.  Respiratory: Negative for cough, shortness of breath and wheezing.   Cardiovascular: Negative for chest pain, palpitations and leg swelling.  Gastrointestinal: Negative for blood in stool, constipation, diarrhea, nausea and vomiting.  Endocrine: Negative for cold intolerance, heat intolerance and polyuria.  Genitourinary: Negative for dyspareunia, dysuria, flank pain, frequency, genital sores, hematuria, menstrual problem, pelvic pain, urgency, vaginal bleeding, vaginal discharge and vaginal pain.  Musculoskeletal: Negative for back pain, joint swelling and myalgias.  Skin: Negative for rash.  Neurological: Negative for dizziness, syncope, light-headedness, numbness and headaches.  Hematological: Negative for adenopathy.  Psychiatric/Behavioral: Negative for agitation, confusion, sleep disturbance and suicidal ideas. The patient is not nervous/anxious.   BREAST: No symptoms   Objective: BP 110/80   Ht 5' 2"  (1.575 m)   Wt 222 lb (100.7 kg)   BMI 40.60 kg/m    Physical Exam Constitutional:      Appearance: She is well-developed.  Genitourinary:     Vulva, vagina, uterus, right adnexa and left adnexa normal.     No vulval lesion or tenderness noted.     No vaginal discharge, erythema or tenderness.     No cervical motion tenderness or polyp.     Uterus is not enlarged or tender.     No right or left adnexal mass present.     Right adnexa not tender.     Left adnexa not tender.  Neck:     Thyroid: No thyromegaly.  Cardiovascular:     Rate and Rhythm: Normal rate and regular rhythm.      Heart sounds: Normal heart sounds. No murmur.  Pulmonary:     Effort: Pulmonary effort is normal.     Breath sounds: Normal breath sounds.  Chest:     Breasts:        Right: No mass, nipple discharge, skin change or tenderness.        Left: No mass, nipple discharge, skin change or tenderness.  Abdominal:     Palpations: Abdomen is soft.     Tenderness: There is no abdominal tenderness. There is no guarding.  Musculoskeletal:  General: Normal range of motion.     Cervical back: Normal range of motion.  Neurological:     General: No focal deficit present.     Mental Status: She is alert and oriented to person, place, and time.     Cranial Nerves: No cranial nerve deficit.  Skin:    General: Skin is warm and dry.  Psychiatric:        Mood and Affect: Mood normal.        Behavior: Behavior normal.        Thought Content: Thought content normal.        Judgment: Judgment normal.  Vitals reviewed.   Nexplanon removal Procedure note - The Nexplanon was noted in the patient's arm and the end was identified. The skin was cleansed with a Betadine solution. A small injection of subcutaneous lidocaine with epinephrine was given over the end of the implant. An incision was made at the end of the implant. The rod was noted in the incision and grasped with a hemostat. It was noted to be intact.  Steri-Strip was placed approximating the incision. Hemostasis was noted.   Nexplanon Insertion  Patient given informed consent, signed copy in the chart, time out was performed.  Appropriate time out taken.  Patient's LEFT arm was prepped and draped in the usual sterile fashion. The ruler used to measure and mark insertion area.  Pt was prepped with betadine swab and then injected with 1.0 cc of 2% lidocaine with epinephrine. Nexplanon removed form packaging,  Device confirmed in needle, then inserted full length of needle and withdrawn per handbook instructions.  Pt insertion site covered with  steri-strip and a bandage.   Minimal blood loss.  Pt tolerated the procedure welL.   Assessment/Plan: Encounter for annual routine gynecological examination  Encounter for screening mammogram for malignant neoplasm of breast - Plan: MM 3D SCREEN BREAST BILATERAL; pt to sched mammo due to [place] breast cancer/increased risk of breast cancer.  Family history of breast cancer - Plan: MM 3D SCREEN BREAST BILATERAL, Integrated [facility] ([place]); MyRisk testing discussed and done today. Will call with results.   Encounter for removal and reinsertion of Nexplanon - Plan: etonogestrel (NEXPLANON) 37 MG IMPL implant         Meds ordered this encounter  Medications  . etonogestrel (NEXPLANON) 68 MG IMPL implant    Sig: 1 each (68 mg total) by Subdermal route once for 1 dose.    Dispense:  1 each    Refill:  0    Order Specific Question:   Supervising Provider    Answer:   Gae Dry [[id number]]    GYN counsel breast self exam, mammography screening, adequate intake of calcium and vitamin D, diet and exercise   She was told to remove the dressing in 12-24 hours, to keep the incision area dry for 24 hours and to remove the Steristrip in 2-3  days.  Notify us if any signs of tenderness, redness, pain, or fevers develop.    F/U  Return in about 1 year (around 01/04/2021).  Taren Dymek B. Mckyla Deckman, PA-C 01/05/2020 2:32 PM

## 2020-01-05 NOTE — Patient Instructions (Signed)
I value your feedback and entrusting us with your care. If you get a [facility] patient survey, I would appreciate you taking the time to let us know about your experience today. Thank you!  As of September 25, 2019, your lab results will be released to your MyChart immediately, before I even have a chance to see them. Please give me time to review them and contact you if there are any abnormalities. Thank you for your patience.   Remove the dressing in 24 hours,  keep the incision area dry for 24 hours and remove the Steristrip in 2-3  days.  Notify us if any signs of tenderness, redness, pain, or fevers develop.  

## 2020-01-15 DIAGNOSIS — Z1371 Encounter for nonprocreative screening for genetic disease carrier status: Secondary | ICD-10-CM

## 2020-01-15 DIAGNOSIS — Z9189 Other specified personal risk factors, not elsewhere classified: Secondary | ICD-10-CM

## 2020-01-15 HISTORY — DX: Encounter for nonprocreative screening for genetic disease carrier status: Z13.71

## 2020-01-15 HISTORY — DX: Other specified personal risk factors, not elsewhere classified: Z91.89

## 2020-01-20 ENCOUNTER — Ambulatory Visit
Admission: RE | Admit: 2020-01-20 | Discharge: 2020-01-20 | Disposition: A | Discharge status: Home / Self Care | Payer: Federal, State, Local not specified - PPO | Source: Ambulatory Visit | Attending: Obstetrics and Gynecology | Admitting: Obstetrics and Gynecology

## 2020-01-20 DIAGNOSIS — Z803 Family history of malignant neoplasm of breast: Secondary | ICD-10-CM | POA: Diagnosis not present

## 2020-01-20 DIAGNOSIS — Z1231 Encounter for screening mammogram for malignant neoplasm of breast: Secondary | ICD-10-CM | POA: Diagnosis not present

## 2020-01-21 ENCOUNTER — Encounter: Payer: Self-pay | Admitting: Obstetrics and Gynecology

## 2020-01-28 ENCOUNTER — Encounter: Payer: Self-pay | Admitting: Obstetrics and Gynecology

## 2020-02-11 ENCOUNTER — Telehealth: Payer: Self-pay | Admitting: Obstetrics and Gynecology

## 2020-02-11 NOTE — Telephone Encounter (Signed)
Pt aware of neg MyRisk results with AXIN2 VUS. IBIS=24.6%. Discussed monthly SBE, yearly CBE and mammos (last mammo neg 4/21) and yearly scr breast MRI. Pt to f/u by 8/21 to schedule before 10/21 if interested. Also discussed tamoxifen vs prophylactic mastectomy with reconstruction.   Patient understands these results only apply to her and her children, and this is not indicative of genetic testing results of her other family members. It is recommended that her other family members have genetic testing done.  Pt also understands negative genetic testing doesn't mean she will never get any of these cancers.   Hard copy mailed to pt. F/u prn.

## 2020-05-24 DIAGNOSIS — Z20822 Contact with and (suspected) exposure to covid-19: Secondary | ICD-10-CM | POA: Diagnosis not present

## 2020-05-25 ENCOUNTER — Emergency Department
Admission: EM | Admit: 2020-05-25 | Discharge: 2020-05-25 | Disposition: A | Discharge status: Home / Self Care | Payer: Federal, State, Local not specified - PPO | Attending: Emergency Medicine | Admitting: Emergency Medicine

## 2020-05-25 ENCOUNTER — Encounter: Payer: Self-pay | Admitting: Intensive Care

## 2020-05-25 ENCOUNTER — Other Ambulatory Visit: Payer: Self-pay

## 2020-05-25 ENCOUNTER — Emergency Department: Payer: Federal, State, Local not specified - PPO

## 2020-05-25 DIAGNOSIS — R509 Fever, unspecified: Secondary | ICD-10-CM | POA: Diagnosis not present

## 2020-05-25 DIAGNOSIS — R21 Rash and other nonspecific skin eruption: Secondary | ICD-10-CM | POA: Diagnosis not present

## 2020-05-25 DIAGNOSIS — U071 COVID-19: Secondary | ICD-10-CM | POA: Diagnosis not present

## 2020-05-25 DIAGNOSIS — R05 Cough: Secondary | ICD-10-CM | POA: Diagnosis not present

## 2020-05-25 DIAGNOSIS — M545 Low back pain: Secondary | ICD-10-CM | POA: Insufficient documentation

## 2020-05-25 LAB — URINALYSIS, COMPLETE (UACMP) WITH MICROSCOPIC
Bacteria, UA: NONE SEEN
Bilirubin Urine: NEGATIVE
Glucose, UA: NEGATIVE mg/dL
Ketones, ur: NEGATIVE mg/dL
Leukocytes,Ua: NEGATIVE
Nitrite: NEGATIVE
Protein, ur: NEGATIVE mg/dL
Specific Gravity, Urine: 1.004 — ABNORMAL LOW (ref 1.005–1.030)
Squamous Epithelial / HPF: NONE SEEN (ref 0–5)
WBC, UA: NONE SEEN WBC/hpf (ref 0–5)
pH: 6 (ref 5.0–8.0)

## 2020-05-25 LAB — SARS CORONAVIRUS 2 BY RT PCR (HOSPITAL ORDER, PERFORMED IN ~~LOC~~ HOSPITAL LAB): SARS Coronavirus 2: POSITIVE — AB

## 2020-05-25 MED ORDER — ACETAMINOPHEN 325 MG PO TABS
650.0000 mg | ORAL_TABLET | Freq: Once | ORAL | Status: AC | PRN
  Administered 2020-05-25: 650 mg via ORAL

## 2020-05-25 MED ORDER — ACETAMINOPHEN 325 MG PO TABS
ORAL_TABLET | ORAL | Status: AC
  Filled 2020-05-25: qty 2

## 2020-05-25 MED ORDER — TRAMADOL HCL 50 MG PO TABS: 50.0000 mg | ORAL_TABLET | Freq: Four times a day (QID) | ORAL | 0 refills | Status: DC | PRN

## 2020-05-25 NOTE — ED Notes (Signed)
Pt states that she is here for back pain and was exposed to covid-19 a week ago.

## 2020-05-25 NOTE — ED Notes (Signed)
Greig Right PA-C aware of patient being Covid positive.

## 2020-05-25 NOTE — ED Triage Notes (Signed)
Patient c/o waking up Saturday morning 05/22/20 with lower back pain. Denies injury. Denies urinary symptoms. Low grade fever in triage.

## 2020-05-25 NOTE — Discharge Instructions (Signed)
Follow-up with your regular doctor as needed Return emergency department if worsening If you feel that you are worsening you may also call the infusion center at [phone], tell them you were referred by [facility] emergency department see if you are a candidate for the infusions. If your cough worsens you should take over-the-counter Mucinex, over-the-counter cough medication, Tylenol for fever

## 2020-05-25 NOTE — ED Provider Notes (Signed)
[facility] Emergency Department Provider Note  ____________________________________________   First MD Initiated Contact with Patient 05/25/20 1925     (approximate)  I have reviewed the triage vital signs and the nursing notes.   HISTORY  Chief Complaint Back Pain, Cough, and Fever    HPI Peggy Garcia is a 34 y.o. female presents emergency department complaining of a low-grade fever, cough, and back pain.  Patient states that she was concerned she had a UTI.  However she has not had any urinary tract symptoms.  She is not had any vaginal discharge.  No abdominal pain.  States her children have had colds and they went to have Covid test done yesterday which were negative.  She denies chest pain or shortness of breath at this time.    Past Medical History:  Diagnosis Date  . Abnormal Pap smear of cervix   . BRCA negative 01/2020   MyRisk neg except AXIN2 VUS  . Family history of breast cancer   . Increased risk of breast cancer 01/2020   IBIS=24.6%    Patient Active Problem List   Diagnosis Date Noted  . Family history of breast cancer 01/05/2020  . Family history of interstitial lung disease 05/22/2016  . History of cesarean section, classical 05/22/2016    Past Surgical History:  Procedure Laterality Date  .  2 c-sections  2014 , 2018  . CHOLECYSTECTOMY    . COLPOSCOPY  2006/2007  . WISDOM TOOTH EXTRACTION Bilateral 2008   upper and lower    Prior to Admission medications   Medication Sig Start Date End Date Taking? Authorizing Provider  albuterol (PROVENTIL HFA;VENTOLIN HFA) 108 (90 Base) MCG/ACT inhaler Inhale 2 puffs into the lungs every 6 (six) hours as needed for wheezing or shortness of breath. 11/07/17   Flinchum, Kelby Aline, FNP  etonogestrel (NEXPLANON) 68 MG IMPL implant 1 each (68 mg total) by Subdermal route once for 1 dose. 01/05/20 3/41/93  Copland, Deirdre Evener, PA-C  traMADol (ULTRAM) 50 MG tablet Take 1 tablet (50 mg  total) by mouth every 6 (six) hours as needed. 05/25/20   Versie Starks, PA-C    Allergies Patient has no known allergies.  Family History  Problem Relation Age of Onset  . Breast cancer Mother 3  . Heart failure Father   . Stroke Father   . Heart attack Father   . Heart disease Maternal Uncle   . Stroke Maternal Grandmother   . Deep vein thrombosis Paternal Grandmother     Social History Social History   Tobacco Use  . Smoking status: Never Smoker  . Smokeless tobacco: Never Used  Vaping Use  . Vaping Use: Never used  Substance Use Topics  . Alcohol use: Yes    Comment: occ  . Drug use: No    Review of Systems  Constitutional: Positive fever/chills Eyes: No visual changes. ENT: No sore throat. Respiratory: Positive cough Cardiovascular: Denies chest pain Gastrointestinal: Denies abdominal pain Genitourinary: Negative for dysuria. Musculoskeletal: Positive for back pain. Skin: Negative for rash. Psychiatric: no mood changes,     ____________________________________________   PHYSICAL EXAM:  VITAL SIGNS: ED Triage Vitals  Enc Vitals Group     BP 05/25/20 1859 138/90     Pulse Rate 05/25/20 1859 (!) 119     Resp 05/25/20 1859 18     Temp 05/25/20 1859 (!) 100.9 F (38.3 C)     Temp Source 05/25/20 1859 Oral     SpO2  05/25/20 1859 99 %     Weight 05/25/20 1900 225 lb (102.1 kg)     Height 05/25/20 1900 5' 2"  (1.575 m)     Head Circumference --      Peak Flow --      Pain Score 05/25/20 1900 9     Pain Loc --      Pain Edu? --      Excl. in [place]? --     Constitutional: Alert and oriented. Well appearing and in no acute distress. Eyes: Conjunctivae are normal.  Head: Atraumatic. Nose: No congestion/rhinnorhea. Mouth/Throat: Mucous membranes are moist.   Neck:  supple no lymphadenopathy noted Cardiovascular: Tachycardic, regular rhythm. Heart sounds are normal Respiratory: Normal respiratory effort.  No retractions, lungs c t a  Abd: soft  nontender bs normal all 4 quad GU: deferred Musculoskeletal: FROM all extremities, warm and well perfused, lumbar spines mildly tender to palpation Neurologic:  Normal speech and language.  Skin:  Skin is warm, dry and intact. No rash noted. Psychiatric: Mood and affect are normal. Speech and behavior are normal.  ____________________________________________   LABS (all labs ordered are listed, but only abnormal results are displayed)  Labs Reviewed  SARS CORONAVIRUS 2 BY RT PCR (HOSPITAL ORDER, [place]) - Abnormal; Notable for the following components:      Result Value   SARS Coronavirus 2 POSITIVE (*)    All other components within normal limits  URINALYSIS, COMPLETE (UACMP) WITH MICROSCOPIC - Abnormal; Notable for the following components:   Color, Urine STRAW (*)    APPearance CLEAR (*)    Specific Gravity, Urine 1.004 (*)    Hgb urine dipstick SMALL (*)    All other components within normal limits   ____________________________________________   ____________________________________________  RADIOLOGY  Chest x-ray is normal  ____________________________________________   PROCEDURES  Procedure(s) performed: No  Procedures    ____________________________________________   INITIAL IMPRESSION / ASSESSMENT AND PLAN / ED COURSE  Pertinent labs & imaging results that were available during my care of the patient were reviewed by me and considered in my medical decision making (see chart for details).   The patient is a 34 year old female presents emergency department for low-grade fever and cough.  Also has low back pain.  See HPI  Physical exam shows patient to have a low-grade temp and is tachycardic.  Remainder exams unremarkable other than a small amount of lumbar tenderness.  DDx: Covid, UTI, pneumonia, viral syndrome  Covid test, chest x-ray  Covid test is positive, chest x-ray is negative  Did explain the findings to the  patient.  She was given the phone number for the infusion center if she is worsening.  Return emergency department she is worsening.  Follow with regular doctor as needed.  She was given a work note stating she would need to quarantine for the full 14 days.  I did inform her that her children must also quarantine.  Most likely they are also positive for Covid.  She states she understands.  She discharged stable condition.       Peggy Garcia was evaluated in Emergency Department on 05/25/2020 for the symptoms described in the history of present illness. She was evaluated in the context of the global COVID-19 pandemic, which necessitated consideration that the patient might be at risk for infection with the SARS-CoV-2 virus that causes COVID-19. Institutional protocols and algorithms that pertain to the evaluation of patients at risk for COVID-19 are in  a state of rapid change based on information released by regulatory bodies including the CDC and federal and state organizations. These policies and algorithms were followed during the patient's care in the ED.    As part of my medical decision making, I reviewed the following data within the [place] notes reviewed and incorporated, Labs reviewed , Old chart reviewed, Radiograph reviewed , Notes from prior ED visits and [place] Controlled Substance Database  ____________________________________________   FINAL CLINICAL IMPRESSION(S) / ED DIAGNOSES  Final diagnoses:  COVID-19      NEW MEDICATIONS STARTED DURING THIS VISIT:  New Prescriptions   TRAMADOL (ULTRAM) 50 MG TABLET    Take 1 tablet (50 mg total) by mouth every 6 (six) hours as needed.     Note:  This document was prepared using Dragon voice recognition software and may include unintentional dictation errors.    Versie Starks, PA-C 05/25/20 2228    Vladimir Crofts, MD 05/25/20 [phone]

## 2020-05-26 ENCOUNTER — Telehealth (HOSPITAL_COMMUNITY): Payer: Self-pay | Admitting: Oncology

## 2020-05-26 ENCOUNTER — Encounter: Payer: Self-pay | Admitting: Obstetrics and Gynecology

## 2020-06-02 ENCOUNTER — Encounter: Payer: Self-pay | Admitting: Emergency Medicine

## 2020-06-02 ENCOUNTER — Emergency Department: Payer: Federal, State, Local not specified - PPO

## 2020-06-02 ENCOUNTER — Other Ambulatory Visit: Payer: Self-pay

## 2020-06-02 ENCOUNTER — Emergency Department
Admission: EM | Admit: 2020-06-02 | Discharge: 2020-06-02 | Disposition: A | Discharge status: Home / Self Care | Payer: Federal, State, Local not specified - PPO | Attending: Emergency Medicine | Admitting: Emergency Medicine

## 2020-06-02 DIAGNOSIS — U071 COVID-19: Secondary | ICD-10-CM | POA: Diagnosis not present

## 2020-06-02 DIAGNOSIS — R0602 Shortness of breath: Secondary | ICD-10-CM | POA: Diagnosis not present

## 2020-06-02 DIAGNOSIS — R509 Fever, unspecified: Secondary | ICD-10-CM | POA: Diagnosis not present

## 2020-06-02 DIAGNOSIS — R918 Other nonspecific abnormal finding of lung field: Secondary | ICD-10-CM | POA: Diagnosis not present

## 2020-06-02 DIAGNOSIS — R000 Tachycardia, unspecified: Secondary | ICD-10-CM | POA: Diagnosis not present

## 2020-06-02 DIAGNOSIS — E860 Dehydration: Secondary | ICD-10-CM | POA: Insufficient documentation

## 2020-06-02 LAB — CBC WITH DIFFERENTIAL/PLATELET
Abs Immature Granulocytes: 0.01 10*3/uL (ref 0.00–0.07)
Basophils Absolute: 0 10*3/uL (ref 0.0–0.1)
Basophils Relative: 0 %
Eosinophils Absolute: 0 10*3/uL (ref 0.0–0.5)
Eosinophils Relative: 0 %
HCT: 40.9 % (ref 36.0–46.0)
Hemoglobin: 13.2 g/dL (ref 12.0–15.0)
Immature Granulocytes: 0 %
Lymphocytes Relative: 25 %
Lymphs Abs: 1.2 10*3/uL (ref 0.7–4.0)
MCH: 25.9 pg — ABNORMAL LOW (ref 26.0–34.0)
MCHC: 32.3 g/dL (ref 30.0–36.0)
MCV: 80.2 fL (ref 80.0–100.0)
Monocytes Absolute: 0.4 10*3/uL (ref 0.1–1.0)
Monocytes Relative: 8 %
Neutro Abs: 3.2 10*3/uL (ref 1.7–7.7)
Neutrophils Relative %: 67 %
Platelets: 168 10*3/uL (ref 150–400)
RBC: 5.1 MIL/uL (ref 3.87–5.11)
RDW: 14.8 % (ref 11.5–15.5)
WBC: 4.8 10*3/uL (ref 4.0–10.5)
nRBC: 0 % (ref 0.0–0.2)

## 2020-06-02 LAB — BASIC METABOLIC PANEL
Anion gap: 11 (ref 5–15)
BUN: 11 mg/dL (ref 6–20)
CO2: 23 mmol/L (ref 22–32)
Calcium: 9 mg/dL (ref 8.9–10.3)
Chloride: 99 mmol/L (ref 98–111)
Creatinine, Ser: 0.8 mg/dL (ref 0.44–1.00)
GFR calc Af Amer: >60 mL/min (ref >60)
GFR calc non Af Amer: >60 mL/min (ref >60)
Glucose, Bld: 99 mg/dL (ref 70–99)
Potassium: 4.3 mmol/L (ref 3.5–5.1)
Sodium: 133 mmol/L — ABNORMAL LOW (ref 135–145)

## 2020-06-02 LAB — FIBRIN DERIVATIVES D-DIMER (ARMC ONLY): Fibrin derivatives D-dimer (ARMC): 379.97 ng/mL (FEU) (ref 0.00–499.00)

## 2020-06-02 LAB — HCG, QUANTITATIVE, PREGNANCY: hCG, Beta Chain, Quant, S: <1 m[IU]/mL (ref <5)

## 2020-06-02 MED ORDER — ACETAMINOPHEN 500 MG PO TABS
1000.0000 mg | ORAL_TABLET | Freq: Once | ORAL | Status: AC
  Administered 2020-06-02: 1000 mg via ORAL
  Filled 2020-06-02: qty 2

## 2020-06-02 MED ORDER — BENZONATATE 100 MG PO CAPS
100.0000 mg | ORAL_CAPSULE | Freq: Once | ORAL | Status: AC
  Administered 2020-06-02: 100 mg via ORAL
  Filled 2020-06-02: qty 1

## 2020-06-02 MED ORDER — BENZONATATE 100 MG PO CAPS: 100.0000 mg | ORAL_CAPSULE | Freq: Four times a day (QID) | ORAL | 0 refills | Status: AC | PRN

## 2020-06-02 MED ORDER — LACTATED RINGERS IV BOLUS
1000.0000 mL | Freq: Once | INTRAVENOUS | Status: AC
  Administered 2020-06-02: 1000 mL via INTRAVENOUS

## 2020-06-02 NOTE — ED Provider Notes (Signed)
[facility] Emergency Department Provider Note  ____________________________________________   First MD Initiated Contact with Patient 06/02/20 1218     (approximate)  I have reviewed the triage vital signs and the nursing notes.   HISTORY  Chief Complaint Fever   HPI Peggy Garcia is a 34 y.o. female with a past medical history of abnormal Pap smear who presents for assessment of persistent fever after being diagnosed with Covid approximately 8 days ago.  Patient states in addition to persistent fevers she has had a cough and some fullness in her throat.  She denies any headache, earache, chest pain, back pain, dental pain, dysuria, rash, extremity pain, acute complaints.  She stated the early in the course of her illness she had some vomiting diarrhea but not over the last several days.  She states she has some mild shortness of breath with exertion but not at rest.  No other alleviating or aggravating factors.  No prior similar episodes.  Patient denies tobacco abuse or EtOH use.         Past Medical History:  Diagnosis Date  . Abnormal Pap smear of cervix   . BRCA negative 01/2020   MyRisk neg except AXIN2 VUS  . Family history of breast cancer   . Increased risk of breast cancer 01/2020   IBIS=24.6%    Patient Active Problem List   Diagnosis Date Noted  . Family history of breast cancer 01/05/2020  . Family history of interstitial lung disease 05/22/2016  . History of cesarean section, classical 05/22/2016    Past Surgical History:  Procedure Laterality Date  .  2 c-sections  2014 , 2018  . CHOLECYSTECTOMY    . COLPOSCOPY  2006/2007  . WISDOM TOOTH EXTRACTION Bilateral 2008   upper and lower    Prior to Admission medications   Medication Sig Start Date End Date Taking? Authorizing Provider  albuterol (PROVENTIL HFA;VENTOLIN HFA) 108 (90 Base) MCG/ACT inhaler Inhale 2 puffs into the lungs every 6 (six) hours as needed for wheezing  or shortness of breath. 11/07/17   Flinchum, Kelby Aline, FNP  etonogestrel (NEXPLANON) 68 MG IMPL implant 1 each (68 mg total) by Subdermal route once for 1 dose. 01/05/20 7/86/75  Copland, Deirdre Evener, PA-C  traMADol (ULTRAM) 50 MG tablet Take 1 tablet (50 mg total) by mouth every 6 (six) hours as needed. 05/25/20   Versie Starks, PA-C    Allergies Patient has no known allergies.  Family History  Problem Relation Age of Onset  . Breast cancer Mother 46  . Heart failure Father   . Stroke Father   . Heart attack Father   . Heart disease Maternal Uncle   . Stroke Maternal Grandmother   . Deep vein thrombosis Paternal Grandmother     Social History Social History   Tobacco Use  . Smoking status: Never Smoker  . Smokeless tobacco: Never Used  Vaping Use  . Vaping Use: Never used  Substance Use Topics  . Alcohol use: Yes    Comment: occ  . Drug use: No    Review of Systems  Review of Systems  Constitutional: Positive for fever and malaise/fatigue. Negative for chills.  HENT: Negative for sore throat.   Eyes: Negative for pain.  Respiratory: Positive for cough and shortness of breath. Negative for stridor.   Cardiovascular: Negative for chest pain.  Gastrointestinal: Positive for diarrhea, nausea and vomiting.  Skin: Negative for rash.  Neurological: Negative for seizures, loss of consciousness  and headaches.  Psychiatric/Behavioral: Negative for suicidal ideas.  All other systems reviewed and are negative.     ____________________________________________   PHYSICAL EXAM:  VITAL SIGNS: ED Triage Vitals  Enc Vitals Group     BP 06/02/20 1003 113/81     Pulse Rate 06/02/20 1003 (!) 129     Resp 06/02/20 1003 18     Temp 06/02/20 1003 99.4 F (37.4 C)     Temp Source 06/02/20 1003 Oral     SpO2 06/02/20 1003 96 %     Weight 06/02/20 1002 225 lb (102.1 kg)     Height 06/02/20 1002 _0  (1.575 m)     Head Circumference --      Peak Flow --      Pain Score  06/02/20 1002 0     Pain Loc --      Pain Edu? --      Excl. in [place]? --    Vitals:   06/02/20 1003 06/02/20 1310  BP: 113/81   Pulse: (!) 129   Resp: 18   Temp: 99.4 F (37.4 C) 99 F (37.2 C)  SpO2: 96%    Physical Exam Vitals and nursing note reviewed.  Constitutional:      General: She is not in acute distress.    Appearance: She is well-developed.  HENT:     Head: Normocephalic and atraumatic.     Right Ear: External ear normal.     Left Ear: External ear normal.     Nose: Nose normal.     Mouth/Throat:     Mouth: Mucous membranes are moist.  Eyes:     Conjunctiva/sclera: Conjunctivae normal.  Cardiovascular:     Rate and Rhythm: Regular rhythm. Tachycardia present.     Heart sounds: No murmur heard.   Pulmonary:     Effort: Pulmonary effort is normal. No respiratory distress.     Breath sounds: Normal breath sounds.  Abdominal:     Palpations: Abdomen is soft.     Tenderness: There is no abdominal tenderness.  Musculoskeletal:     Cervical back: Neck supple.  Skin:    General: Skin is warm and dry.     Capillary Refill: Capillary refill takes less than 2 seconds.  Neurological:     Mental Status: She is alert and oriented to person, place, and time.  Psychiatric:        Mood and Affect: Mood normal.   No stridor.  No uvular deviation or tonsillar enlargement.  Patient has full range of motion of her neck.  ____________________________________________   LABS (all labs ordered are listed, but only abnormal results are displayed)  Labs Reviewed  CBC WITH DIFFERENTIAL/PLATELET - Abnormal; Notable for the following components:      Result Value   MCH 25.9 (*)    All other components within normal limits  BASIC METABOLIC PANEL - Abnormal; Notable for the following components:   Sodium 133 (*)    All other components within normal limits  FIBRIN DERIVATIVES D-DIMER ([facility] ONLY)  HCG, QUANTITATIVE, PREGNANCY  POC URINE PREG, ED    ____________________________________________  EKG  EKG with a sinus rhythm of 97, normal axis, unremarkable intervals, no evidence of acute ischemia or other underlying arrhythmia. ____________________________________________  RADIOLOGY  ED MD interpretation: Unremarkable.  Official radiology report(s): DG Chest 1 View  Result Date: 06/02/2020 CLINICAL DATA:  COVID positive 1 week ago.  Persistent fever. EXAM: CHEST  1 VIEW COMPARISON:  Chest x-ray 05/25/2020 FINDINGS:  The cardiac silhouette, mediastinal and hilar contours are within normal limits. Patchy bilateral ill-defined infiltrates consistent with atypical/viral pneumonia such as COVID pneumonia. No focal airspace consolidation or pleural effusion. IMPRESSION: Patchy bilateral infiltrates consistent with COVID pneumonia. Electronically Signed   By: Marijo Sanes M.D.   On: 06/02/2020 13:47    ____________________________________________   PROCEDURES  Procedure(s) performed (including Critical Care):  Procedures   ____________________________________________   INITIAL IMPRESSION / ASSESSMENT AND PLAN / ED COURSE        Overall patient's history, exam, and ED work-up is most consistent with mild dehydration in the setting of known Covid pneumonia.  Patient is noted to be slightly tachycardic at 129 with otherwise stable vital signs on arrival.  Exam and work-up as above.  Low suspicion for ACS given patient denies any chest pain and is reassuring EKG.  Low suspicion for PE as patient's D-dimer is less than 500.  Chest x-ray shows no evidence of significant volume overload, effusion, or pneumothorax.  Patient is not significantly anemic and her no significant active or metabolic derangements.  Patient was given IV fluids and Zofran.  On reassessment her heart rate was noted to be in the high 90s.  Discharged stable condition.  Strict return precautions advised discussed.  Medications  lactated ringers bolus 1,000 mL  (1,000 mLs Intravenous New Bag/Given 06/02/20 1321)  acetaminophen (TYLENOL) tablet 1,000 mg (1,000 mg Oral Given 06/02/20 1311)  benzonatate (TESSALON) capsule 100 mg (100 mg Oral Given 06/02/20 1311)    FINAL CLINICAL IMPRESSION(S) / ED DIAGNOSES  Final diagnoses:  COVID-19  Dehydration     ED Discharge Orders    None       Note:  This document was prepared using Dragon voice recognition software and may include unintentional dictation errors.   Lucrezia Starch, MD 06/02/20 1505

## 2020-06-02 NOTE — ED Notes (Signed)
See triage note  Presents with con't fever  States she was tested positive for COVID about 8 days ago  Low grade temp on arrival

## 2020-06-02 NOTE — ED Triage Notes (Signed)
Pt reports tested positive for COVID over a week ago and still has a fever.

## 2020-06-15 NOTE — Telephone Encounter (Signed)
Re: [facility]  Does not qualify.   Past Medical History:  Diagnosis Date  . Abnormal Pap smear of cervix   . BRCA negative 01/2020   MyRisk neg except AXIN2 VUS  . Family history of breast cancer   . Increased risk of breast cancer 01/2020   IBIS=24.6%    Faythe Casa, NP 06/15/2020 2:33 PM

## 2021-08-09 NOTE — Telephone Encounter (Signed)
Nexplanon rcvd/charged 01/05/2020

## 2021-08-19 ENCOUNTER — Ambulatory Visit: Payer: Federal, State, Local not specified - PPO | Admitting: Nurse Practitioner

## 2021-08-19 ENCOUNTER — Other Ambulatory Visit: Payer: Self-pay

## 2021-08-19 ENCOUNTER — Encounter: Payer: Self-pay | Admitting: Nurse Practitioner

## 2021-08-19 VITALS — BP 112/78 | HR 110 | Temp 98.9°F | Resp 16

## 2021-08-19 DIAGNOSIS — J101 Influenza due to other identified influenza virus with other respiratory manifestations: Secondary | ICD-10-CM

## 2021-08-19 LAB — POCT INFLUENZA A/B
Influenza A, POC: POSITIVE — AB
Influenza B, POC: NEGATIVE

## 2021-08-19 MED ORDER — OSELTAMIVIR PHOSPHATE 75 MG PO CAPS: 75.0000 mg | ORAL_CAPSULE | Freq: Two times a day (BID) | ORAL | 0 refills | Status: DC

## 2021-08-19 MED ORDER — OSELTAMIVIR PHOSPHATE 75 MG PO CAPS: 75.0000 mg | ORAL_CAPSULE | Freq: Two times a day (BID) | ORAL | 0 refills | Status: AC

## 2021-08-19 NOTE — Addendum Note (Signed)
Addended by: Viviano Simas E on: 08/19/2021 10:40 AM   Modules accepted: Orders

## 2021-08-19 NOTE — Progress Notes (Signed)
   Subjective:    Patient ID: [name] Bethards, female    DOB: 03/22/1986, 35 y.o.   MRN: [id number]  HPI  35 year old female presenting to [facility] with complaints of flu like symptoms. Her daughter was told she had the flu yesterday at the doctor.   Patient had a low grade fever this am, has mild congestion and body aches now   Today's Vitals   08/19/21 0942  BP: 112/78  Pulse: (!) 110  Resp: 16  Temp: 98.9 F (37.2 C)  TempSrc: Tympanic  SpO2: 97%   There is no height or weight on file to calculate BMI.       Review of Systems  Constitutional:  Positive for fever.  HENT:  Positive for congestion.   Respiratory:  Positive for cough.   Cardiovascular: Negative.   Genitourinary: Negative.   Musculoskeletal:  Positive for myalgias.  Neurological: Negative.   Hematological: Negative.       Objective:   Physical Exam Constitutional:      Appearance: Normal appearance.  HENT:     Head: Normocephalic.     Right Ear: Tympanic membrane and ear canal normal.     Left Ear: Tympanic membrane and ear canal normal.     Nose: Congestion present.  Eyes:     Pupils: Pupils are equal, round, and reactive to light.  Cardiovascular:     Rate and Rhythm: Normal rate and regular rhythm.     Heart sounds: Normal heart sounds.  Pulmonary:     Effort: Pulmonary effort is normal.     Breath sounds: Normal breath sounds.  Musculoskeletal:     Cervical back: Normal range of motion.  Skin:    General: Skin is warm.     Capillary Refill: Capillary refill takes less than 2 seconds.  Neurological:     General: No focal deficit present.     Mental Status: She is alert.  Psychiatric:        Mood and Affect: Mood normal.     Recent Results (from the past 2160 hour(s))  POCT Influenza A/B     Status: Abnormal   Collection Time: 08/19/21  9:54 AM  Result Value Ref Range   Influenza A, POC Positive (A) Negative   Influenza B, POC Negative Negative         Assessment & Plan:  1. Influenza A Push fluids, rest. Vitamin C for immune support. Alternate tylenol and Advil for fever and body aches. Use OTC decongestants as needed for symptom support follow package directions.   - oseltamivir (TAMIFLU) 75 MG capsule; Take 1 capsule (75 mg total) by mouth 2 (two) times daily for 5 days. Take with food  Dispense: 10 capsule; Refill: 0 - POCT Influenza A/B  May stop anti-viral with any SE as discussed.  Remain out of work until fever free for 24 hours.  RTC for ongoing or worsening symptoms as discussed

## 2021-09-16 DIAGNOSIS — L309 Dermatitis, unspecified: Secondary | ICD-10-CM | POA: Diagnosis not present

## 2021-10-04 ENCOUNTER — Encounter: Payer: Self-pay | Admitting: Medical

## 2021-10-04 ENCOUNTER — Other Ambulatory Visit: Payer: Self-pay

## 2021-10-04 ENCOUNTER — Ambulatory Visit: Payer: Federal, State, Local not specified - PPO | Admitting: Medical

## 2021-10-04 VITALS — BP 129/78 | HR 101 | Temp 98.0°F | Resp 16 | Ht 62.0 in | Wt 235.0 lb

## 2021-10-04 DIAGNOSIS — R051 Acute cough: Secondary | ICD-10-CM

## 2021-10-04 DIAGNOSIS — B9689 Other specified bacterial agents as the cause of diseases classified elsewhere: Secondary | ICD-10-CM

## 2021-10-04 MED ORDER — BENZONATATE 100 MG PO CAPS: 100.0000 mg | ORAL_CAPSULE | Freq: Three times a day (TID) | ORAL | 0 refills | Status: DC | PRN

## 2021-10-04 MED ORDER — AZITHROMYCIN 250 MG PO TABS: ORAL_TABLET | ORAL | 0 refills | Status: AC

## 2021-10-04 NOTE — Progress Notes (Signed)
° °  Subjective:    Patient ID: Peggy Garcia, female    DOB: 1986-03-08, 35 y.o.   MRN: [id number]  HPI 35 yo female in non acute distress presents with  cough x 2-3 days.  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 Cough producstive yellow and green in the morning only.    Review of Systems  Constitutional:  Negative for chills, fatigue and fever.  HENT:  Positive for postnasal drip and sore throat (slight feels it is from coughing). Negative for congestion, ear discharge, ear pain, rhinorrhea, sinus pressure and sinus pain.   Respiratory:  Positive for cough. Negative for chest tightness, shortness of breath and wheezing.   Cardiovascular:  Negative for chest pain.  Gastrointestinal:  Negative for abdominal pain, diarrhea, nausea and vomiting.  Musculoskeletal:  Negative for myalgias.  Skin:  Negative for color change.  Neurological:  Negative for dizziness, syncope, light-headedness and headaches.      Objective:   Physical Exam Vitals and nursing note reviewed.  Constitutional:      Appearance: Normal appearance.  HENT:     Head: Normocephalic and atraumatic.     Right Ear: Tympanic membrane, ear canal and external ear normal.     Left Ear: Tympanic membrane, ear canal and external ear normal.     Mouth/Throat:     Mouth: Mucous membranes are moist.     Pharynx: Oropharynx is clear.  Eyes:     Extraocular Movements: Extraocular movements intact.     Conjunctiva/sclera: Conjunctivae normal.     Pupils: Pupils are equal, round, and reactive to light.  Cardiovascular:     Rate and Rhythm: Normal rate and regular rhythm.  Pulmonary:     Effort: Pulmonary effort is normal.     Breath sounds: Normal breath sounds.  Musculoskeletal:        General: Normal range of motion.     Cervical back: Normal range of motion and neck supple.  Skin:    General: Skin is warm and dry.     Capillary Refill: Capillary refill takes less  than 2 seconds.  Neurological:     General: No focal deficit present.     Mental Status: She is alert and oriented to person, place, and time. Mental status is at baseline.  Psychiatric:        Mood and Affect: Mood normal.        Behavior: Behavior normal.        Thought Content: Thought content normal.        Judgment: Judgment normal.          Assessment & Plan:   Encounter Diagnoses  Name Primary?   Bacterial respiratory infection Yes   Acute cough     Meds ordered this encounter  Medications   azithromycin (ZITHROMAX) 250 MG tablet    Sig: Take 2 tablets on day 1, then 1 tablet daily on days 2 through 5    Dispense:  6 tablet    Refill:  0   benzonatate (TESSALON PERLES) 100 MG capsule    Sig: Take 1 capsule (100 mg total) by mouth 3 (three) times daily as needed.    Dispense:  30 capsule    Refill:  0   Return to clinic as needed, Patient verbalizes understanding and has no questions at discharge.

## 2021-12-02 IMAGING — DX DG CHEST 1V
1 series · 1 of 1 positions shown · non-contrast
Comparison: Chest x-ray 05/25/2020

CLINICAL DATA: COVID positive 1 week ago.  Persistent fever.

EXAM:
CHEST  1 VIEW

[chest ap]
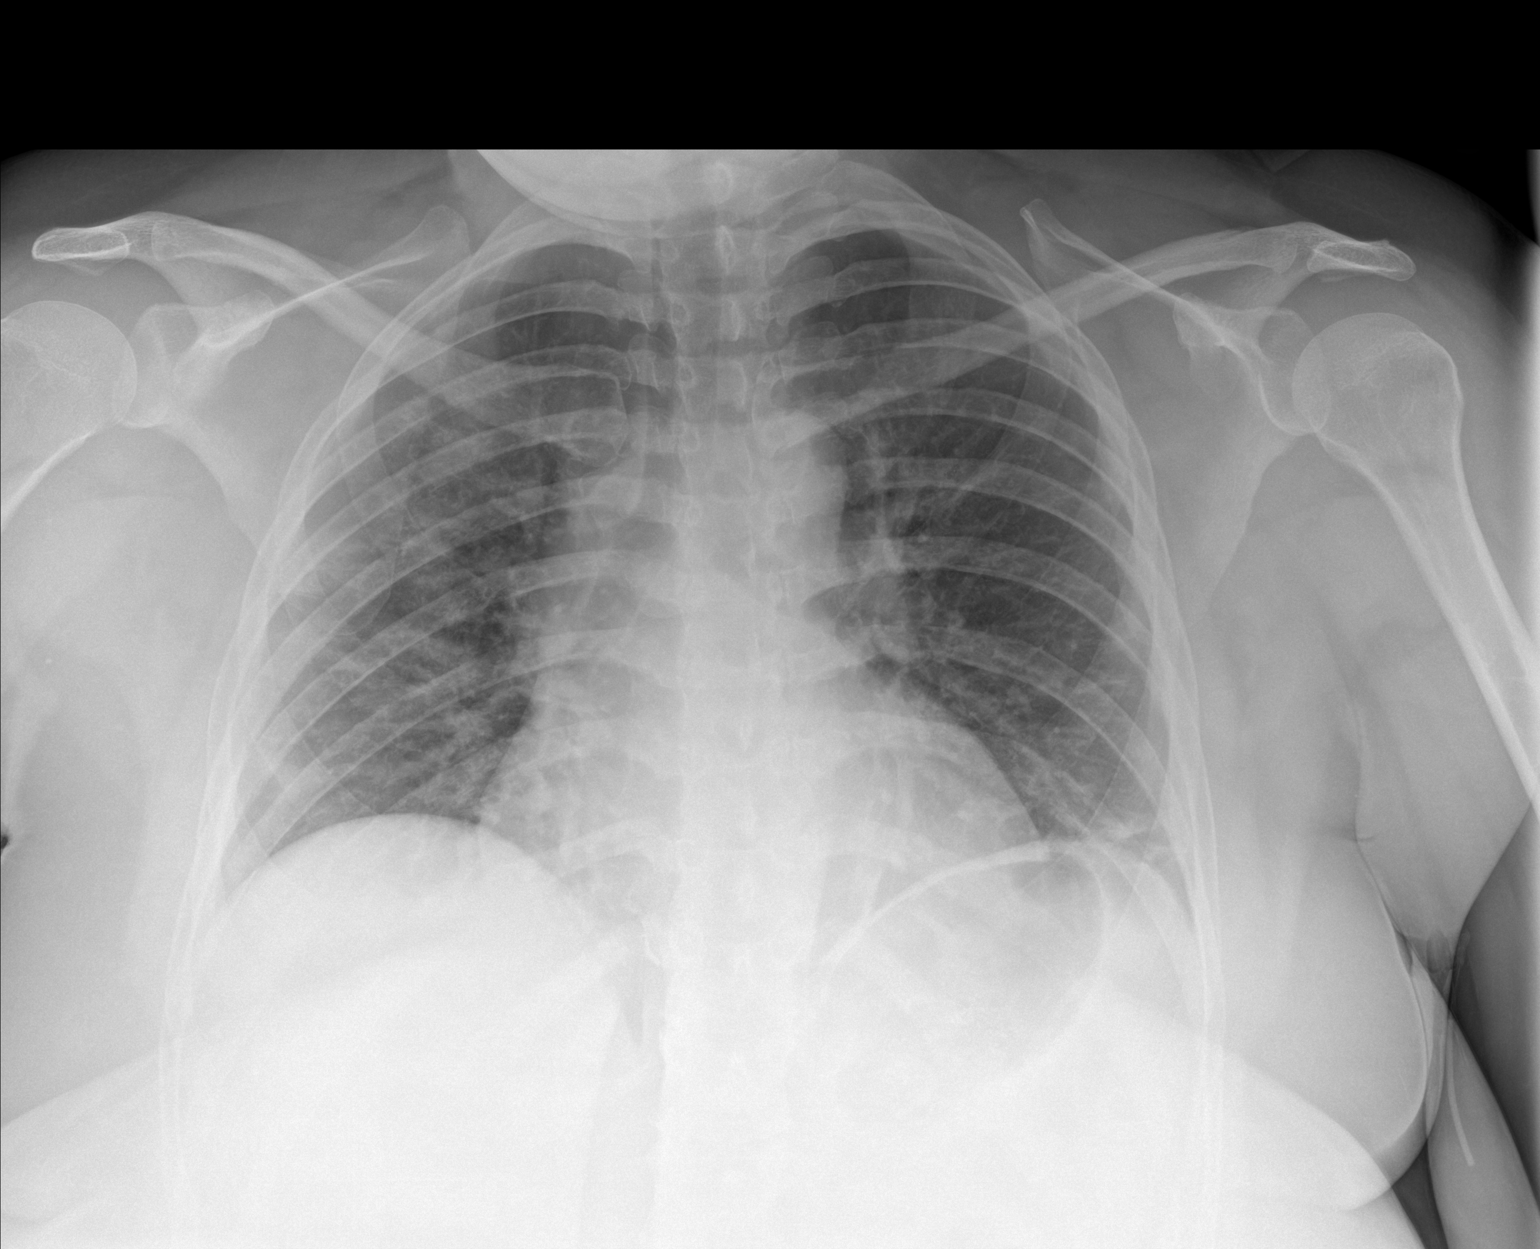

[1 of 1 positions shown; findings below may reference images not displayed]

FINDINGS: The cardiac silhouette, mediastinal and hilar contours are within
normal limits.

Patchy bilateral ill-defined infiltrates consistent with
atypical/viral pneumonia such as COVID pneumonia. No focal airspace
consolidation or pleural effusion.
IMPRESSION: Patchy bilateral infiltrates consistent with COVID pneumonia.

## 2022-01-13 ENCOUNTER — Encounter: Payer: Self-pay | Admitting: Registered Nurse

## 2022-01-13 ENCOUNTER — Ambulatory Visit: Payer: Federal, State, Local not specified - PPO | Admitting: Registered Nurse

## 2022-01-13 VITALS — BP 124/80 | HR 125 | Temp 100.0°F | Resp 16

## 2022-01-13 DIAGNOSIS — R509 Fever, unspecified: Secondary | ICD-10-CM

## 2022-01-13 DIAGNOSIS — B349 Viral infection, unspecified: Secondary | ICD-10-CM

## 2022-01-13 DIAGNOSIS — Z20822 Contact with and (suspected) exposure to covid-19: Secondary | ICD-10-CM

## 2022-01-13 DIAGNOSIS — J029 Acute pharyngitis, unspecified: Secondary | ICD-10-CM

## 2022-01-13 LAB — POCT INFLUENZA A/B
Influenza A, POC: NEGATIVE
Influenza B, POC: NEGATIVE

## 2022-01-13 LAB — POC COVID19 BINAXNOW: SARS Coronavirus 2 Ag: NEGATIVE

## 2022-01-13 MED ORDER — LOPERAMIDE HCL 2 MG PO TABS: 2.0000 mg | ORAL_TABLET | Freq: Four times a day (QID) | ORAL | 0 refills | Status: DC | PRN

## 2022-01-13 MED ORDER — ACETAMINOPHEN 500 MG PO TABS: 1000.0000 mg | ORAL_TABLET | Freq: Four times a day (QID) | ORAL | 0 refills | Status: AC | PRN

## 2022-01-13 NOTE — Progress Notes (Signed)
? ?Subjective:  ? ? Patient ID: Peggy Garcia, female    DOB: 10/07/1986, 36 y.o.   MRN: [id number] ? ?35y/o african [place] female established to clinic new to provider with new scratchy throat and loose stools today.  Had flu A in November 2022.  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  Coworker standing in doorway of her office, desk along back wall of office and both unmasked.  Not up to date on covid vaccines did not receive fall booster 2022.  Hasn't performed home covid test yet but wearing mask when around others.  Had coffee and water with breakfast this am.  Urinating normal amount  Denied headache/ear pain/discharge/sinus pain/pressure/cough/white spots in back of throat/enlarged lymph nodes.  History of seasonal rhinitis (never diagnosed with allergies) and bilateral thigh muscles sore today. ? ? ? ? ?Review of Systems  ?Constitutional:  Negative for activity change, appetite change, chills, diaphoresis, fatigue and fever.  ?HENT:  Positive for sore throat. Negative for congestion, ear discharge, ear pain, facial swelling, hearing loss, mouth sores, nosebleeds, postnasal drip, rhinorrhea, sinus pressure, sinus pain, sneezing, tinnitus, trouble swallowing and voice change.   ?Eyes:  Negative for photophobia and visual disturbance.  ?Respiratory:  Negative for cough, shortness of breath, wheezing and stridor.   ?Cardiovascular:  Negative for leg swelling.  ?Gastrointestinal:  Positive for diarrhea. Negative for abdominal pain, anal bleeding, blood in stool, constipation, nausea and vomiting.  ?Endocrine: Negative for cold intolerance and heat intolerance.  ?Genitourinary:  Negative for difficulty urinating.  ?Musculoskeletal:  Positive for myalgias. Negative for arthralgias, back pain, gait problem, joint swelling, neck pain and neck stiffness.  ?Skin:  Negative for color change, pallor, rash and wound.  ?Allergic/Immunologic: Positive for  environmental allergies. Negative for food allergies.  ?Neurological:  Negative for dizziness, tremors, seizures, syncope, facial asymmetry, speech difficulty, weakness, light-headedness, numbness and headaches.  ?Hematological:  Negative for adenopathy. Does not bruise/bleed easily.  ?Psychiatric/Behavioral:  Negative for agitation, confusion and sleep disturbance.   ? ?   ?Objective:  ? Physical Exam ?Vitals and nursing note reviewed.  ?Constitutional:   ?   General: She is awake. She is not in acute distress. ?   Appearance: Normal appearance. She is well-developed and well-groomed. She is obese. She is not ill-appearing, toxic-appearing or diaphoretic.  ?HENT:  ?   Head: Normocephalic and atraumatic.  ?   Jaw: There is normal jaw occlusion. No trismus.  ?   Salivary Glands: Right salivary gland is not diffusely enlarged or tender. Left salivary gland is not diffusely enlarged or tender.  ?   Right Ear: Hearing, ear canal and external ear normal. A middle ear effusion is present.  ?   Left Ear: Hearing, ear canal and external ear normal. A middle ear effusion is present.  ?   Nose: Mucosal edema, congestion and rhinorrhea present. No nasal deformity, septal deviation or laceration. Rhinorrhea is clear.  ?   Right Nostril: No epistaxis.  ?   Left Nostril: No epistaxis.  ?   Right Turbinates: Not enlarged, swollen or pale.  ?   Left Turbinates: Not enlarged, swollen or pale.  ?   Right Sinus: No maxillary sinus tenderness or frontal sinus tenderness.  ?   Left Sinus: No maxillary sinus tenderness or frontal sinus tenderness.  ?   Mouth/Throat:  ?   Lips: Pink. No lesions.  ?   Mouth: Mucous membranes are moist. Mucous membranes are not pale, not  dry and not cyanotic. No lacerations, oral lesions or angioedema.  ?   Dentition: No gingival swelling, dental abscesses or gum lesions.  ?   Tongue: No lesions. Tongue does not deviate from midline.  ?   Palate: No mass and lesions.  ?   Pharynx: Uvula midline. Pharyngeal  swelling and posterior oropharyngeal erythema present. No oropharyngeal exudate or uvula swelling.  ?   Tonsils: No tonsillar exudate or tonsillar abscesses. 0 on the right. 0 on the left.  ?   Comments: Cobblestoning posterior pharynx; bilateral TMs air fluid level scattered opacity; bilateral allergic shiners; nasal sniffing/congestion wearing surgical mask in clinic; clear discharge bilateral nares ?Eyes:  ?   General: Lids are normal. Vision grossly intact. Gaze aligned appropriately. Allergic shiner present. No scleral icterus.    ?   Right eye: No foreign body, discharge or hordeolum.     ?   Left eye: No foreign body, discharge or hordeolum.  ?   Extraocular Movements: Extraocular movements intact.  ?   Right eye: Normal extraocular motion and no nystagmus.  ?   Left eye: Normal extraocular motion and no nystagmus.  ?   Conjunctiva/sclera: Conjunctivae normal.  ?   Right eye: Right conjunctiva is not injected. No chemosis, exudate or hemorrhage. ?   Left eye: Left conjunctiva is not injected. No chemosis, exudate or hemorrhage. ?   Pupils: Pupils are equal, round, and reactive to light. Pupils are equal.  ?   Right eye: Pupil is round and reactive.  ?   Left eye: Pupil is round and reactive.  ?Neck:  ?   Thyroid: No thyroid mass or thyromegaly.  ?   Trachea: Trachea and phonation normal. No tracheal tenderness or tracheal deviation.  ?Cardiovascular:  ?   Rate and Rhythm: Regular rhythm. Tachycardia present.  ?   Pulses: Normal pulses.     ?     Radial pulses are 2+ on the right side and 2+ on the left side.  ?   Heart sounds: Normal heart sounds. No murmur heard. ?  No friction rub. No gallop.  ?Pulmonary:  ?   Effort: Pulmonary effort is normal. No accessory muscle usage or respiratory distress.  ?   Breath sounds: Normal breath sounds and air entry. No stridor, decreased air movement or transmitted upper airway sounds. No decreased breath sounds, wheezing, rhonchi or rales.  ?   Comments: Spoke full  sentences without difficulty; wearing surgical mask in exam room; no cough or throat clearing observed ?Chest:  ?   Chest wall: No tenderness.  ?Abdominal:  ?   General: Bowel sounds are normal. There is no distension.  ?   Palpations: Abdomen is soft.  ?   Tenderness: There is no guarding.  ?Musculoskeletal:     ?   General: No swelling, tenderness or deformity. Normal range of motion.  ?   Right hand: Normal. No swelling, deformity or lacerations. Normal range of motion. Normal strength.  ?   Left hand: Normal. No swelling, deformity or lacerations. Normal range of motion. Normal strength.  ?   Cervical back: Normal range of motion and neck supple. No swelling, edema, deformity, erythema, signs of trauma, lacerations, rigidity, spasms, torticollis, tenderness or crepitus. No pain with movement or muscular tenderness. Normal range of motion.  ?   Thoracic back: No swelling, edema, deformity, signs of trauma, lacerations, spasms or tenderness. Normal range of motion.  ?   Lumbar back: No swelling, edema, deformity, signs of  trauma, lacerations, spasms or tenderness. Normal range of motion.  ?   Right lower leg: No swelling. No edema.  ?   Left lower leg: No swelling. No edema.  ?Lymphadenopathy:  ?   Head:  ?   Right side of head: No submental, submandibular, tonsillar, preauricular, posterior auricular or occipital adenopathy.  ?   Left side of head: No submental, submandibular, tonsillar, preauricular, posterior auricular or occipital adenopathy.  ?   Cervical: No cervical adenopathy.  ?   Right cervical: No superficial, deep or posterior cervical adenopathy. ?   Left cervical: No superficial, deep or posterior cervical adenopathy.  ?Skin: ?   General: Skin is warm and dry.  ?   Capillary Refill: Capillary refill takes less than 2 seconds.  ?   Coloration: Skin is not ashen, cyanotic, jaundiced, mottled, pale or sallow.  ?   Findings: No abrasion, abscess, acne, bruising, burn, ecchymosis, erythema, signs of  injury, laceration, lesion, petechiae, rash or wound.  ?   Nails: There is no clubbing.  ?   Comments: Patient wearing infinity scarf around neck  ?Neurological:  ?   General: No focal deficit present.  ?   Mental Status:

## 2022-01-13 NOTE — Patient Instructions (Addendum)
Fever, Adult ?  ?A fever is an increase in the body's temperature. It is usually defined as a temperature of 100.4?F (38?C) or higher. Brief mild or moderate fevers generally have no long-term effects, and they often do not need treatment. Moderate or high fevers may make you feel uncomfortable and can sometimes be a sign of a serious illness or disease. The sweating that may occur with repeated or prolonged fever may also cause a loss of fluid in the body (dehydration). ?Fever is confirmed by taking a temperature with a thermometer. A measured temperature can vary with: ?Age. ?Time of day. ?Where in the body you take the temperature. Readings may vary if you place the thermometer: ?In the mouth (oral). ?In the rectum (rectal). ?In the ear (tympanic). ?Under the arm (axillary). ?On the forehead (temporal). ?Follow these instructions at home: ?Medicines ?Take over-the counter and prescription medicines only as told by your health care provider. Follow the dosing instructions carefully. ?If you were prescribed an antibiotic medicine, take it as told by your health care provider. Do not stop taking the antibiotic even if you start to feel better. ?General instructions ?Watch your condition for any changes. Let your health care provider know about them. ?Rest as needed. ?Drink enough fluid to keep your urine pale yellow. This helps to prevent dehydration. ?Sponge yourself or bathe with room-temperature water to help reduce your body temperature as needed. Do not use ice water. ?Do not use too many blankets or wear clothes that are too heavy. ?If your fever may be caused by an infection that spreads from person to person (is contagious), such as a cold or the flu, you should stay home from work and public gatherings for at least 24 hours after your fever is gone. Your fever should be gone without the need to use medicines. ?Contact a health care provider if: ?You vomit. ?You cannot eat or drink without vomiting. ?You  have diarrhea. ?You have pain when you urinate. ?Your symptoms do not improve with treatment. ?You develop new symptoms. ?You develop excessive weakness. ?Get help right away if: ?You have shortness of breath or have trouble breathing. ?You are dizzy or you faint. ?You are disoriented or confused. ?You develop signs of dehydration, such as: ?Dark urine, very little urine, or no urine. ?Cracked lips. ?Dry mouth. ?Sunken eyes. ?Sleepiness. ?Weakness. ?You develop severe pain in your abdomen. ?You have persistent vomiting or diarrhea. ?You develop a skin rash. ?Your symptoms suddenly get worse. ?Summary ?A fever is an increase in the body's temperature. It is usually defined as a temperature of 100.4?F (38?C) or higher. Moderate or high fevers can sometimes be a sign of a serious illness or disease. The sweating that may occur with repeated or prolonged fever may also cause dehydration. ?Pay attention to any changes in your symptoms and contact your health care provider if your symptoms do not improve with treatment. ?Take over-the counter and prescription medicines only as told by your health care provider. Follow the dosing instructions carefully. ?If your fever is from an infection that may be contagious, such as cold or flu, you should stay home from work and public gatherings for at least 24 hours after your fever is gone. Your fever should be gone without the need to use medicines. ?Get help right away if you develop signs of dehydration, such as dark urine, cracked lips, dry mouth, sunken eyes, sleepiness, or weakness. ?This information is not intended to replace advice given to you by your health  care provider. Make sure you discuss any questions you have with your health care provider. ?Document Revised: 02/22/2021 Document Reviewed: 02/22/2021 ?Elsevier Patient Education ? [place]. ?Food Choices to Help Relieve Diarrhea, Adult ?Diarrhea can make you feel weak and cause you to become dehydrated. It  is important to choose the right foods and drinks to: ?Relieve diarrhea. ?Replace lost fluids and nutrients. ?Prevent dehydration. ?What are tips for following this plan? ?Relieving diarrhea ?Avoid foods that make your diarrhea worse. These may include: ?Foods and beverages sweetened with high-fructose corn syrup, honey, or sweeteners such as xylitol, sorbitol, and mannitol. ?Fried, greasy, or spicy foods. ?Raw fruits and vegetables. ?Eat foods that are rich in probiotics. These include foods such as yogurt and fermented milk products. Probiotics can help increase healthy bacteria in your stomach and intestines (gastrointestinal tract or GI tract). This may help digestion and stop diarrhea. ?If you have lactose intolerance, avoid dairy products. These may make your diarrhea worse. ?Take medicine to help stop diarrhea only as told by your health care provider. ?Replacing nutrients ? ?Eat bland, easy-to-digest foods in small amounts as you are able, until your diarrhea starts to get better. These foods include bananas, applesauce, rice, toast, and crackers. ?Gradually reintroduce nutrient-rich foods as tolerated or as told by your health care provider. This includes: ?Well-cooked protein foods, such as eggs, lean meats like fish or chicken without skin, and tofu. ?Peeled, seeded, and soft-cooked fruits and vegetables. ?Low-fat dairy products. ?Whole grains. ?Take vitamin and mineral supplements as told by your health care provider. ?Preventing dehydration ? ?Start by sipping water or a solution to prevent dehydration (oral rehydration solution, ORS). This is a drink that helps replace fluids and minerals your body has lost. You can buy an ORS at pharmacies and retail stores. ?Try to drink at least 8-10 cups (2,000-2,500 mL) of fluid each day to help replace lost fluids. If you have urine that is pale yellow, you are getting enough fluids. ?You may drink other liquids in addition to water, such as fruit juice that you  have added water to (diluted fruit juice) or low-calorie sports drinks, as tolerated or as told by your health care provider. ?Avoid drinks with caffeine, such as coffee, tea, or soft drinks. ?Avoid alcohol. ?Summary ?When you have diarrhea, it is important to choose the right foods and drinks to relieve diarrhea, to replace lost fluids and nutrients, and to prevent dehydration. ?Make sure you drink enough fluid to keep your urine pale yellow. ?You may benefit from eating bland foods at first. Gradually reintroduce healthy, nutrient-rich foods as tolerated or as told by your health care provider. ?Avoid foods that make your diarrhea worse, such as fried, greasy, or spicy foods. ?This information is not intended to replace advice given to you by your health care provider. Make sure you discuss any questions you have with your health care provider. ?Document Revised: 11/18/2019 Document Reviewed: 11/18/2019 ?Elsevier Patient Education ? [place]. ?Diarrhea, Adult ?Diarrhea is frequent loose and watery bowel movements. Diarrhea can make you feel weak and cause you to become dehydrated. Dehydration can make you tired and thirsty, cause you to have a dry mouth, and decrease how often you urinate. ?Diarrhea typically lasts 2-3 days. However, it can last longer if it is a sign of something more serious. It is important to treat your diarrhea as told by your health care provider. ?Follow these instructions at home: ?Eating and drinking ?  ?Follow these recommendations as told by  your health care provider: ?Take an oral rehydration solution (ORS). This is an over-the-counter medicine that helps return your body to its normal balance of nutrients and water. It is found at pharmacies and retail stores. ?Drink plenty of fluids, such as water, ice chips, diluted fruit juice, and low-calorie sports drinks. You can drink milk also, if desired. ?Avoid drinking fluids that contain a lot of sugar or caffeine, such as energy  drinks, sports drinks, and soda. ?Eat bland, easy-to-digest foods in small amounts as you are able. These foods include bananas, applesauce, rice, lean meats, toast, and crackers. ?Avoid alcohol. ?Avoid s

## 2022-07-09 DIAGNOSIS — Z9189 Other specified personal risk factors, not elsewhere classified: Secondary | ICD-10-CM | POA: Insufficient documentation

## 2022-07-09 NOTE — Progress Notes (Signed)
PCP:  Chad Cordial, PA-C   Chief Complaint  Patient presents with   Gynecologic Exam    No concerns     HPI:      [name] is a 36 y.o. [id number] who LMP was No LMP recorded. Patient has had an implant., presents today for her annual examination.  Her menses are occas spotting with nexplanon. Dysmenorrhea none. She does not have intermenstrual bleeding. Does have occas PMS sx. Has noticed wt gain, without focusing on diet/exercise; questions if related to nexplanon.  Sex activity: single partner, contraception - Nexplanon inserted 01/26/17 and Replaced 01/05/20. Did xulane and nuvaring in past. Doesn't want depo for wt gain. 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 There is a FH of breast cancer in her mom. Her sisters are gene neg. 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 IBIS=24.6%. There is no FH of ovarian cancer. The patient does do self-breast exams. Hasn't had scr breast MRI.   Tobacco use: The patient denies current or previous tobacco use. Alcohol use: social drinker No drug use.  Exercise: min active  She does get adequate calcium and Vitamin D in her diet. No recent labs.   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   Family history of breast cancer    Increased risk of breast cancer 01/2020   IBIS=24.6%    Past Surgical History:  Procedure Laterality Date    2 c-sections  2014 , 2018   CHOLECYSTECTOMY     COLPOSCOPY  2006/2007   WISDOM TOOTH EXTRACTION Bilateral 2008   upper and lower    Family History  Problem Relation Age of Onset   Breast cancer Mother 48   Heart failure Father    Stroke Father    Heart attack Father    Heart disease Maternal Uncle    Stroke Maternal Grandmother    Deep vein thrombosis Paternal Grandmother     Social  History   Socioeconomic History   Marital status: Married    Spouse name: Not on file   Number of children: Not on file   Years of education: Not on file   Highest education level: Not on file  Occupational History   Not on file  Tobacco Use   Smoking status: Never   Smokeless tobacco: Never  Vaping Use   Vaping Use: Never used  Substance and Sexual Activity   Alcohol use: Yes    Comment: occ   Drug use: No   Sexual activity: Yes    Birth control/protection: Implant  Other Topics Concern   Not on file  Social History Narrative   Not on file   Social Determinants of Health   Financial Resource Strain: Not on file  Food Insecurity: Not on file  Transportation Needs: Not on file  Physical Activity: Not on file  Stress: Not on file  Social Connections: Not on file  Intimate Partner Violence: Not on file    Outpatient Medications Prior to Visit  Medication Sig Dispense Refill   albuterol (PROVENTIL HFA;VENTOLIN HFA) 108 (90 Base) MCG/ACT inhaler Inhale 2 puffs into the lungs every 6 (six) hours as needed for wheezing or shortness of breath. 1 Inhaler 0   etonogestrel (NEXPLANON) 68 MG IMPL implant  1 each (68 mg total) by Subdermal route once for 1 dose. 1 each 0   loperamide (IMODIUM A-D) 2 MG tablet Take 1 tablet (2 mg total) by mouth 4 (four) times daily as needed for diarrhea or loose stools. 30 tablet 0   No facility-administered medications prior to visit.      ROS:  Review of Systems  Constitutional:  Negative for fatigue, fever and unexpected weight change.  Respiratory:  Negative for cough, shortness of breath and wheezing.   Cardiovascular:  Negative for chest pain, palpitations and leg swelling.  Gastrointestinal:  Negative for blood in stool, constipation, diarrhea, nausea and vomiting.  Endocrine: Negative for cold intolerance, heat intolerance and polyuria.  Genitourinary:  Negative for dyspareunia, dysuria, flank pain, frequency, genital sores,  hematuria, menstrual problem, pelvic pain, urgency, vaginal bleeding, vaginal discharge and vaginal pain.  Musculoskeletal:  Negative for back pain, joint swelling and myalgias.  Skin:  Negative for rash.  Neurological:  Negative for dizziness, syncope, light-headedness, numbness and headaches.  Hematological:  Negative for adenopathy.  Psychiatric/Behavioral:  Negative for agitation, confusion, sleep disturbance and suicidal ideas. The patient is not nervous/anxious.   BREAST: No symptoms   Objective: BP 100/70   Ht 5' 2"  (1.575 m)   Wt 233 lb (105.7 kg)   BMI 42.62 kg/m    Physical Exam Constitutional:      Appearance: She is well-developed.  Genitourinary:     Vulva normal.     Right Labia: No rash, tenderness or lesions.    Left Labia: No tenderness, lesions or rash.    No vaginal discharge, erythema or tenderness.      Right Adnexa: not tender and no mass present.    Left Adnexa: not tender and no mass present.    No cervical motion tenderness, friability or polyp.     Uterus is not enlarged or tender.  Breasts:    Right: No mass, nipple discharge, skin change or tenderness.     Left: No mass, nipple discharge, skin change or tenderness.  Neck:     Thyroid: No thyromegaly.  Cardiovascular:     Rate and Rhythm: Normal rate and regular rhythm.     Heart sounds: Normal heart sounds. No murmur heard. Pulmonary:     Effort: Pulmonary effort is normal.     Breath sounds: Normal breath sounds.  Abdominal:     Palpations: Abdomen is soft.     Tenderness: There is no abdominal tenderness. There is no guarding or rebound.  Musculoskeletal:        General: Normal range of motion.     Cervical back: Normal range of motion.  Lymphadenopathy:     Cervical: No cervical adenopathy.  Neurological:     General: No focal deficit present.     Mental Status: She is alert and oriented to person, place, and time.     Cranial Nerves: No cranial nerve deficit.  Skin:    General:  Skin is warm and dry.  Psychiatric:        Mood and Affect: Mood normal.        Behavior: Behavior normal.        Thought Content: Thought content normal.        Judgment: Judgment normal.  Vitals reviewed.    Assessment/Plan: Encounter for annual routine gynecological examination Encounter for annual routine gynecological examination  Cervical cancer screening - Plan: Cytology - PAP  Screening for HPV (human papillomavirus) - Plan: Cytology - PAP  Encounter  for surveillance of implantable subdermal contraceptive--due for removal 3/24. Discussed replacement or mirena IUD to avoid same systemic hormone exposure in case contributing to wt gain. F/u prn.   Encounter for screening mammogram for malignant neoplasm of breast - Plan: MM 3D SCREEN BREAST BILATERAL  Family history of breast cancer - Plan: MM 3D SCREEN BREAST BILATERAL; pt is MyRisk neg  Increased risk of breast cancer - Plan: MM 3D SCREEN BREAST BILATERAL; pt aware of recommendations of monthly SBE, yearly CBE and mammos, and scr breast MRI. Pt will f/u for MRI ref prn. Cont Vit D supp  Weight gain - Plan: Comprehensive metabolic panel, Lipid panel, Hemoglobin A1c, TSH; check labs. Diet/exercise changes in meantime. Doubt it's related to nexplanon. F/u prn.  BMI 40.0-44.9, adult ([place]) - Plan: Comprehensive metabolic panel, Lipid panel, Hemoglobin A1c, TSH  Blood tests for routine general physical examination - Plan: Comprehensive metabolic panel, Lipid panel, Hemoglobin A1c, TSH  Screening cholesterol level - Plan: Lipid panel  Thyroid disorder screening - Plan: TSH  Screening for diabetes mellitus - Plan: Hemoglobin A1c   GYN counsel breast self exam, mammography screening, adequate intake of calcium and vitamin D, diet and exercise     F/U  Return in about 1 year (around 07/11/2023).  Ronny Ruddell B. Kameo Bains, PA-C 07/10/2022 10:50 AM

## 2022-07-10 ENCOUNTER — Encounter: Payer: Self-pay | Admitting: Obstetrics and Gynecology

## 2022-07-10 ENCOUNTER — Ambulatory Visit (INDEPENDENT_AMBULATORY_CARE_PROVIDER_SITE_OTHER): Payer: Federal, State, Local not specified - PPO | Admitting: Obstetrics and Gynecology

## 2022-07-10 ENCOUNTER — Other Ambulatory Visit (HOSPITAL_COMMUNITY)
Admission: RE | Admit: 2022-07-10 | Discharge: 2022-07-10 | Disposition: A | Discharge status: Home / Self Care | Payer: Federal, State, Local not specified - PPO | Source: Ambulatory Visit | Attending: Obstetrics and Gynecology | Admitting: Obstetrics and Gynecology

## 2022-07-10 VITALS — BP 100/70 | Ht 62.0 in | Wt 233.0 lb

## 2022-07-10 DIAGNOSIS — Z9189 Other specified personal risk factors, not elsewhere classified: Secondary | ICD-10-CM

## 2022-07-10 DIAGNOSIS — Z01419 Encounter for gynecological examination (general) (routine) without abnormal findings: Secondary | ICD-10-CM | POA: Diagnosis not present

## 2022-07-10 DIAGNOSIS — Z1329 Encounter for screening for other suspected endocrine disorder: Secondary | ICD-10-CM

## 2022-07-10 DIAGNOSIS — Z6841 Body Mass Index (BMI) 40.0 and over, adult: Secondary | ICD-10-CM | POA: Diagnosis not present

## 2022-07-10 DIAGNOSIS — Z13220 Encounter for screening for lipoid disorders: Secondary | ICD-10-CM | POA: Diagnosis not present

## 2022-07-10 DIAGNOSIS — Z124 Encounter for screening for malignant neoplasm of cervix: Secondary | ICD-10-CM | POA: Diagnosis not present

## 2022-07-10 DIAGNOSIS — Z1151 Encounter for screening for human papillomavirus (HPV): Secondary | ICD-10-CM | POA: Diagnosis not present

## 2022-07-10 DIAGNOSIS — R635 Abnormal weight gain: Secondary | ICD-10-CM

## 2022-07-10 DIAGNOSIS — Z1231 Encounter for screening mammogram for malignant neoplasm of breast: Secondary | ICD-10-CM

## 2022-07-10 DIAGNOSIS — Z0000 Encounter for general adult medical examination without abnormal findings: Secondary | ICD-10-CM

## 2022-07-10 DIAGNOSIS — Z3046 Encounter for surveillance of implantable subdermal contraceptive: Secondary | ICD-10-CM

## 2022-07-10 DIAGNOSIS — Z803 Family history of malignant neoplasm of breast: Secondary | ICD-10-CM

## 2022-07-10 DIAGNOSIS — Z131 Encounter for screening for diabetes mellitus: Secondary | ICD-10-CM

## 2022-07-10 NOTE — Patient Instructions (Signed)
I value your feedback and you entrusting us with your care. If you get a [facility] patient survey, I would appreciate you taking the time to let us know about your experience today. Thank you!  [facility]: [phone]      

## 2022-07-11 LAB — COMPREHENSIVE METABOLIC PANEL
ALT: 19 IU/L (ref 0–32)
AST: 16 IU/L (ref 0–40)
Albumin/Globulin Ratio: 1.4 (ref 1.2–2.2)
Albumin: 4 g/dL (ref 3.9–4.9)
Alkaline Phosphatase: 84 IU/L (ref 44–121)
BUN/Creatinine Ratio: 15 (ref 9–23)
BUN: 11 mg/dL (ref 6–20)
Bilirubin Total: 0.3 mg/dL (ref 0.0–1.2)
CO2: 23 mmol/L (ref 20–29)
Calcium: 9.4 mg/dL (ref 8.7–10.2)
Chloride: 103 mmol/L (ref 96–106)
Creatinine, Ser: 0.73 mg/dL (ref 0.57–1.00)
Globulin, Total: 2.8 g/dL (ref 1.5–4.5)
Glucose: 88 mg/dL (ref 70–99)
Potassium: 4.7 mmol/L (ref 3.5–5.2)
Sodium: 137 mmol/L (ref 134–144)
Total Protein: 6.8 g/dL (ref 6.0–8.5)
eGFR: 108 mL/min/{1.73_m2} (ref >59)

## 2022-07-11 LAB — TSH: TSH: 0.918 u[IU]/mL (ref 0.450–4.500)

## 2022-07-11 LAB — HEMOGLOBIN A1C
Est. average glucose Bld gHb Est-mCnc: 123 mg/dL
Hgb A1c MFr Bld: 5.9 % — ABNORMAL HIGH (ref 4.8–5.6)

## 2022-07-11 LAB — LIPID PANEL
Chol/HDL Ratio: 3.9 ratio (ref 0.0–4.4)
Cholesterol, Total: 206 mg/dL — ABNORMAL HIGH (ref 100–199)
HDL: 53 mg/dL (ref >39)
LDL Chol Calc (NIH): 141 mg/dL — ABNORMAL HIGH (ref 0–99)
Triglycerides: 67 mg/dL (ref 0–149)
VLDL Cholesterol Cal: 12 mg/dL (ref 5–40)

## 2022-07-11 LAB — CYTOLOGY - PAP
Adequacy: ABSENT
Comment: NEGATIVE
Diagnosis: NEGATIVE
Diagnosis: REACTIVE
High risk HPV: NEGATIVE

## 2022-07-27 ENCOUNTER — Encounter: Payer: Self-pay | Admitting: Physician Assistant

## 2022-07-27 ENCOUNTER — Ambulatory Visit (INDEPENDENT_AMBULATORY_CARE_PROVIDER_SITE_OTHER): Payer: Self-pay | Admitting: Physician Assistant

## 2022-07-27 VITALS — BP 120/80 | HR 128 | Temp 99.2°F | Ht 62.0 in | Wt 233.6 lb

## 2022-07-27 DIAGNOSIS — R000 Tachycardia, unspecified: Secondary | ICD-10-CM

## 2022-07-27 DIAGNOSIS — J309 Allergic rhinitis, unspecified: Secondary | ICD-10-CM

## 2022-07-27 DIAGNOSIS — J069 Acute upper respiratory infection, unspecified: Secondary | ICD-10-CM

## 2022-07-27 MED ORDER — ALBUTEROL SULFATE HFA 108 (90 BASE) MCG/ACT IN AERS: 2.0000 | INHALATION_SPRAY | Freq: Four times a day (QID) | RESPIRATORY_TRACT | 0 refills | Status: AC | PRN

## 2022-07-27 NOTE — Progress Notes (Signed)
[facility] [address], [place] [address]   Office Visit Note  Patient Name: Peggy Garcia Date of Birth [id number]  Medical Record number [id number]  Date of Service: 07/27/2022  Chief Complaint  Patient presents with   Cough    Started last Friday. Getting some mucus up, but not much. Dry cough, sinus drainage. No ST or ear pain. Mucus is yellowish.      36 y/o F presents to the clinic for c/o cough x 6 days. Denies other cold symptoms. Denies fever, SOB, CP, wheezing, body aches. Hasn't taken any otc medicine for her symptoms. Worse cough at night or getting up in AM.   Cough Pertinent negatives include no shortness of breath or wheezing.      Current Medication:  Outpatient Encounter Medications as of 07/27/2022  Medication Sig   albuterol (VENTOLIN HFA) 108 (90 Base) MCG/ACT inhaler Inhale 2 puffs into the lungs every 6 (six) hours as needed for wheezing or shortness of breath.   [DISCONTINUED] albuterol (PROVENTIL HFA;VENTOLIN HFA) 108 (90 Base) MCG/ACT inhaler Inhale 2 puffs into the lungs every 6 (six) hours as needed for wheezing or shortness of breath.   etonogestrel (NEXPLANON) 68 MG IMPL implant 1 each (68 mg total) by Subdermal route once for 1 dose.   No facility-administered encounter medications on file as of 07/27/2022.      Medical History: 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   Family history of breast cancer    Increased risk of breast cancer 01/2020   IBIS=24.6%     Vital Signs: BP 120/80 (BP Location: Left Arm, Patient Position: Sitting, Cuff Size: Normal)   Pulse (!) 128   Temp 99.2 F (37.3 C) (Tympanic)   Ht 5' 2"  (1.575 m)   Wt 233 lb 9.6 oz (106 kg)   SpO2 98%   BMI 42.73 kg/m    Review of Systems  Constitutional: Negative.   HENT: Negative.    Respiratory:  Positive for cough. Negative for chest tightness, shortness  of breath and wheezing.   Cardiovascular: Negative.   Neurological: Negative.     Physical Exam Constitutional:      Appearance: Normal appearance.  HENT:     Head: Atraumatic.     Right Ear: Tympanic membrane, ear canal and external ear normal.     Left Ear: Tympanic membrane, ear canal and external ear normal.     Nose: Congestion present.     Right Turbinates: Enlarged.     Left Turbinates: Enlarged.     Mouth/Throat:     Mouth: Mucous membranes are moist.     Pharynx: Oropharynx is clear.  Eyes:     Extraocular Movements: Extraocular movements intact.  Cardiovascular:     Rate and Rhythm: Regular rhythm. Tachycardia present.  Pulmonary:     Effort: Pulmonary effort is normal.     Breath sounds: Normal breath sounds. No wheezing, rhonchi or rales.  Musculoskeletal:     Cervical back: Neck supple.  Skin:    General: Skin is warm.  Neurological:     Mental Status: She is alert.  Psychiatric:        Mood and Affect: Mood normal.        Behavior: Behavior normal.        Thought Content: Thought content normal.        Judgment: Judgment  normal.       Assessment/Plan:  1. URI with cough and congestion - albuterol (VENTOLIN HFA) 108 (90 Base) MCG/ACT inhaler; Inhale 2 puffs into the lungs every 6 (six) hours as needed for wheezing or shortness of breath.  Dispense: 8 g; Refill: 0  2. Allergic rhinitis, unspecified seasonality, unspecified trigger  3. Tachycardia  Increase fluids Start otc Mucinex-DM and take as directed Start otc oral anti-histamine as directed on the box. Start a humidifier at [facility] May fill Rx for inhaler and use as prescribed if symptoms don't improve or worsen. Continue to watch for worsening symptoms. RTC prn. PT will contact us if her symptoms don't improve or worsen.  Pt verbalized understanding and in agreement.  Today's visit is a 20 minute F2F encounter.   General Counseling: caliah kopke understanding of the findings of todays  visit and agrees with plan of treatment. I have discussed any further diagnostic evaluation that may be needed or ordered today. We also reviewed her medications today. she has been encouraged to call the office with any questions or concerns that should arise related to todays visit.    Time spent:20 [place] [address], [place] Physician Assistant

## 2022-07-28 ENCOUNTER — Encounter: Payer: Self-pay | Admitting: Physician Assistant

## 2022-08-10 ENCOUNTER — Ambulatory Visit
Admission: RE | Admit: 2022-08-10 | Discharge: 2022-08-10 | Disposition: A | Discharge status: Home / Self Care | Payer: Federal, State, Local not specified - PPO | Source: Ambulatory Visit | Attending: Obstetrics and Gynecology | Admitting: Obstetrics and Gynecology

## 2022-08-10 DIAGNOSIS — Z1231 Encounter for screening mammogram for malignant neoplasm of breast: Secondary | ICD-10-CM | POA: Insufficient documentation

## 2022-08-10 DIAGNOSIS — Z803 Family history of malignant neoplasm of breast: Secondary | ICD-10-CM | POA: Insufficient documentation

## 2022-08-10 DIAGNOSIS — Z9189 Other specified personal risk factors, not elsewhere classified: Secondary | ICD-10-CM | POA: Insufficient documentation

## 2022-08-28 ENCOUNTER — Ambulatory Visit (INDEPENDENT_AMBULATORY_CARE_PROVIDER_SITE_OTHER): Payer: Self-pay | Admitting: Physician Assistant

## 2022-08-28 ENCOUNTER — Encounter: Payer: Self-pay | Admitting: Physician Assistant

## 2022-08-28 VITALS — BP 130/84 | HR 113 | Temp 98.8°F | Ht 62.0 in | Wt 232.0 lb

## 2022-08-28 DIAGNOSIS — J028 Acute pharyngitis due to other specified organisms: Secondary | ICD-10-CM

## 2022-08-28 DIAGNOSIS — B9789 Other viral agents as the cause of diseases classified elsewhere: Secondary | ICD-10-CM

## 2022-08-28 NOTE — Progress Notes (Signed)
[facility] [address],  [address]   Office Visit Note  Patient Name: Peggy Garcia Date of Birth [id number]  Medical Record number [id number]  Date of Service: 08/28/2022  Chief Complaint  Patient presents with   Sore Throat    Started yesterday. Some body aches. Post nasal drip that started this morning causing a cough. No fever. Has been using an inhaler     36 y/o F presents to the clinic for c/o sore throat x 2 days. Pt denies known exposure to strep. Able to eat solids and drink liquids without pain. Denies fever, CP, SOB, or body aches. Slight dry cough.   Sore Throat  Pertinent negatives include no congestion or trouble swallowing.      Current Medication:  Outpatient Encounter Medications as of 08/28/2022  Medication Sig   albuterol (VENTOLIN HFA) 108 (90 Base) MCG/ACT inhaler Inhale 2 puffs into the lungs every 6 (six) hours as needed for wheezing or shortness of breath.   etonogestrel (NEXPLANON) 68 MG IMPL implant 1 each (68 mg total) by Subdermal route once for 1 dose.   No facility-administered encounter medications on file as of 08/28/2022.      Medical History: 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   Family history of breast cancer    Increased risk of breast cancer 01/2020   IBIS=24.6%     Vital Signs: BP 130/84 (BP Location: Left Arm, Patient Position: Sitting, Cuff Size: Normal)   Pulse (!) 113   Temp 98.8 F (37.1 C) (Tympanic)   Ht _0  (1.575 m)   Wt 232 lb (105.2 kg)   SpO2 99%   BMI 42.43 kg/m    Review of Systems  Constitutional: Negative.   HENT:  Positive for sore throat. Negative for congestion, postnasal drip and trouble swallowing.   Respiratory: Negative.    Cardiovascular: Negative.   Neurological: Negative.     Physical Exam Constitutional:      Appearance: Normal appearance.  HENT:     Head:  Atraumatic.     Right Ear: Tympanic membrane, ear canal and external ear normal.     Left Ear: Tympanic membrane, ear canal and external ear normal.     Nose: Nose normal.     Mouth/Throat:     Mouth: Mucous membranes are moist.     Pharynx: Oropharynx is clear.  Eyes:     Extraocular Movements: Extraocular movements intact.  Cardiovascular:     Rate and Rhythm: Normal rate and regular rhythm.  Pulmonary:     Effort: Pulmonary effort is normal.     Breath sounds: Normal breath sounds.  Musculoskeletal:     Cervical back: Neck supple.  Skin:    General: Skin is warm.  Neurological:     Mental Status: She is alert.  Psychiatric:        Mood and Affect: Mood normal.        Behavior: Behavior normal.        Thought Content: Thought content normal.        Judgment: Judgment normal.       Assessment/Plan:  1. Viral sore throat  Increase fluids Take otc oral anti-histamine and oral decongestant as directed on the box Throat logenzes OTC Ibuprofen or Tylenol if pain is severe Warm water gargles or soft ice cream to help soothe the throat Continue  to watch for worsening symptoms Pt will contact us if her symptoms worsen.    General Counseling: maryah marinaro understanding of the findings of todays visit and agrees with plan of treatment. I have discussed any further diagnostic evaluation that may be needed or ordered today. We also reviewed her medications today. she has been encouraged to call the office with any questions or concerns that should arise related to todays visit.    Time spent:20 [place], [place] Physician Assistant

## 2023-01-15 ENCOUNTER — Ambulatory Visit (INDEPENDENT_AMBULATORY_CARE_PROVIDER_SITE_OTHER): Payer: Self-pay | Admitting: Physician Assistant

## 2023-01-15 ENCOUNTER — Other Ambulatory Visit: Payer: Self-pay

## 2023-01-15 VITALS — BP 114/72 | HR 97 | Temp 97.4°F

## 2023-01-15 DIAGNOSIS — J069 Acute upper respiratory infection, unspecified: Secondary | ICD-10-CM

## 2023-01-15 NOTE — Progress Notes (Signed)
[facility] [address], [place] [address]   Office Visit Note  Patient Name: Peggy Garcia Sensing Date of Birth [id number]  Medical Record number [id number]  Date of Service: 01/15/2023  Chief Complaint  Patient presents with   Sore Throat   Sinus Problem     37 y/o F presents to the clinic for c/o sinus congestion, post nasal drainage, and feeling of throat tightness x 4 days. She has been able to eat and drink, but painful. Able to talk without pain. Denies known exposure to strep. NO recent dental procedures. Denies fever, chills, CP, SOB. Started taking Ibuprofen for the throat pain and helps, as well as taking Claritin and Benadryl. No h/o allergies, but has noticed congestion around this time of the year.   Sore Throat  Associated symptoms include congestion. Pertinent negatives include no trouble swallowing.  Sinus Problem Associated symptoms include congestion and a sore throat. Pertinent negatives include no sinus pressure.      Current Medication:  Outpatient Encounter Medications as of 01/15/2023  Medication Sig   albuterol (VENTOLIN HFA) 108 (90 Base) MCG/ACT inhaler Inhale 2 puffs into the lungs every 6 (six) hours as needed for wheezing or shortness of breath.   etonogestrel (NEXPLANON) 68 MG IMPL implant 1 each (68 mg total) by Subdermal route once for 1 dose.   No facility-administered encounter medications on file as of 01/15/2023.      Medical History: 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   Family history of breast cancer    Increased risk of breast cancer 01/2020   IBIS=24.6%     Vital Signs: BP 114/72 (BP Location: Left Arm, Patient Position: Sitting)   Pulse 97   Temp (!) 97.4 F (36.3 C) (Tympanic)   SpO2 97%    Review of Systems  Constitutional: Negative.   HENT:  Positive for congestion, postnasal drip and sore throat. Negative for sinus  pressure, sinus pain and trouble swallowing.   Respiratory: Negative.    Cardiovascular: Negative.   Neurological: Negative.     Physical Exam Constitutional:      Appearance: Normal appearance.  HENT:     Head: Atraumatic.     Right Ear: Tympanic membrane, ear canal and external ear normal.     Left Ear: Tympanic membrane, ear canal and external ear normal.     Nose: Nose normal.     Right Turbinates: Enlarged and pale.     Left Turbinates: Enlarged and pale.     Mouth/Throat:     Mouth: Mucous membranes are moist.     Pharynx: Oropharynx is clear.     Tonsils: 1+ on the right. 1+ on the left.     Comments: No pharyngeal erythema or exudate. Eyes:     Extraocular Movements: Extraocular movements intact.  Cardiovascular:     Rate and Rhythm: Normal rate and regular rhythm.  Pulmonary:     Effort: Pulmonary effort is normal.     Breath sounds: Normal breath sounds.  Musculoskeletal:     Cervical back: Neck supple.  Skin:    General: Skin is warm.  Neurological:     Mental Status: She is alert.  Psychiatric:        Mood and Affect: Mood normal.        Behavior: Behavior normal.        Thought Content: Thought  content normal.        Judgment: Judgment normal.       Assessment/Plan:  1. Viral upper respiratory tract infection  Stay well hydrated. Start Sudafed and Flonase Continue with Claritin Continue to watch for worsening symptoms. Pt will call if not feeling better and will consider oral steroids. Pt verbalized understanding and in agreement.    General Counseling: Peggy Garcia understanding of the findings of todays visit and agrees with plan of treatment. I have discussed any further diagnostic evaluation that may be needed or ordered today. We also reviewed her medications today. she has been encouraged to call the office with any questions or concerns that should arise related to todays visit.    Time spent:20 [place],  [place] Physician Assistant

## 2023-01-16 ENCOUNTER — Ambulatory Visit (INDEPENDENT_AMBULATORY_CARE_PROVIDER_SITE_OTHER): Payer: Self-pay | Admitting: Physician Assistant

## 2023-01-16 DIAGNOSIS — J0100 Acute maxillary sinusitis, unspecified: Secondary | ICD-10-CM

## 2023-01-16 MED ORDER — METHYLPREDNISOLONE 4 MG PO TBPK: ORAL_TABLET | ORAL | 0 refills | Status: DC

## 2023-01-16 MED ORDER — AMOXICILLIN-POT CLAVULANATE 875-125 MG PO TABS: 1.0000 | ORAL_TABLET | Freq: Two times a day (BID) | ORAL | 0 refills | Status: AC

## 2023-01-16 NOTE — Progress Notes (Signed)
Virtual Visit Consent   Peggy Garcia, you are scheduled for a virtual visit with a [facility] provider today. Just as with appointments in the office, your consent must be obtained to participate. Your consent will be active for this visit and any virtual visit you may have with one of our providers in the next 365 days. If you have a MyChart account, a copy of this consent can be sent to you electronically.  As this is a virtual telephone visit which doesn't allow your provider to perform a traditional examination and may limit your provider's ability to fully assess your condition. If your provider identifies any concerns that need to be evaluated in person or the need to arrange testing (such as labs, EKG, etc.), we will make arrangements to do so. Although advances in technology are sophisticated, we cannot ensure that it will always work on either your end or our end. If the connection with a video visit is poor, the visit may have to be switched to a telephone visit. With either a video or telephone visit, we are not always able to ensure that we have a secure connection.  By engaging in this virtual visit, you consent to the provision of healthcare and authorize for your insurance to be billed (if applicable) for the services provided during this visit. Depending on your insurance coverage, you may receive a charge related to this service.  I need to obtain your verbal consent now. Are you willing to proceed with your visit today? Peggy Garcia has provided verbal consent on 01/16/2023 for a virtual visit (video or telephone). Peggy Garcia, [place]  Date: 01/16/2023 2:58 PM  Virtual Visit via Telephone Note   I, Peggy Garcia, connected with  Peggy Garcia  ([id number], [birth year]) on 01/16/23 at  3:00 PM EDT by a telephone and verified that I am speaking with the correct person using two identifiers.  Location: Patient: Virtual Visit Location Patient: Home Provider: Virtual Visit  Location Provider: Office/Clinic   I discussed the limitations of evaluation and management by telemedicine and the availability of in person appointments. The patient expressed understanding and agreed to proceed.    History of Present Illness: Peggy Garcia is a 37 y.o. who identifies as a female who was assigned female at birth, and is being seen today for sinus symptoms.  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   Family history of interstitial lung disease 05/22/2016   History of cesarean section, classical 05/22/2016   BMI 40.0-44.9, adult 05/22/2016    Allergies: No Known Allergies Medications:  Current Outpatient Medications:    amoxicillin-clavulanate (AUGMENTIN) 875-125 MG tablet, Take 1 tablet by mouth 2 (two) times daily for 5 days., Disp: 10 tablet, Rfl: 0   methylPREDNISolone (MEDROL DOSEPAK) 4 MG TBPK tablet, Take as directed, Disp: 1 each, Rfl: 0   albuterol (VENTOLIN HFA) 108 (90 Base) MCG/ACT inhaler, Inhale 2 puffs into the lungs every 6 (six) hours as needed for wheezing or shortness of breath., Disp: 8 g, Rfl: 0   etonogestrel (NEXPLANON) 68 MG IMPL implant, 1 each (68 mg total) by Subdermal route once for 1 dose., Disp: 1 each, Rfl: 0  Observations/Objective: Patient is well-developed, well-nourished in no acute distress.  Resting comfortably  at home.  Head is normocephalic, atraumatic.  No  labored breathing.  Speech is clear and coherent with logical content.  Patient is alert and oriented at baseline.    Assessment and Plan: 1. Acute non-recurrent maxillary sinusitis - amoxicillin-clavulanate (AUGMENTIN) 875-125 MG tablet; Take 1 tablet by mouth 2 (two) times daily for 5 days.  Dispense: 10 tablet; Refill: 0 - methylPREDNISolone (MEDROL DOSEPAK) 4 MG TBPK tablet; Take as directed   Dispense: 1 each; Refill: 0  Take medicine as prescribed. RTC prn Pt verbalized understanding and in agreement.    Follow Up Instructions: I discussed the assessment and treatment plan with the patient. The patient was provided an opportunity to ask questions and all were answered. The patient agreed with the plan and demonstrated an understanding of the instructions.  A copy of instructions were sent to the patient via MyChart unless otherwise noted below.    The patient was advised to call back or seek an in-person evaluation if the symptoms worsen or if the condition fails to improve as anticipated.  Time:  I spent 10 minutes with the patient via telehealth technology discussing the above problems/concerns.    Peggy Acosta, PA-C

## 2023-07-02 NOTE — Progress Notes (Unsigned)
   No chief complaint on file.    History of Present Illness:  Peggy Garcia is a 37 y.o. that had a nexplanon placed approximately {NUMBERS:20191} {MONTH/YR:310907}  ago. Since that time, she  ***.  There were no vitals taken for this visit.   Nexplanon removal Procedure note - The Nexplanon was noted in the patient's arm and the end was identified. The skin was cleansed with a Betadine solution. A small injection of subcutaneous lidocaine with epinephrine was given over the end of the implant. An incision was made at the end of the implant. The rod was noted in the incision and grasped with a hemostat. It was noted to be intact.  Steri-Strip was placed approximating the incision. Hemostasis was noted.  Assessment: Nexplanon removal   Plan:   She was told to remove the dressing in 12-24 hours, to keep the incision area dry for 24 hours and to remove the Steristrip in 2-3  days.  Notify us if any signs of tenderness, redness, pain, or fevers develop.    No chief complaint on file.    IUD PROCEDURE NOTE:  Peggy Garcia is a 37 y.o. [phone] here for {type:23561}  IUD insertion for ***   There were no vitals taken for this visit.  IUD Insertion Procedure Note Patient identified, informed consent performed, consent signed.   Discussed risks of irregular bleeding, cramping, infection, malpositioning or misplacement of the IUD outside the uterus which may require further procedure such as laparoscopy, risk of failure <1%. Time out was performed.  Urine pregnancy test negative.***  Speculum placed in the vagina.  Cervix visualized.  Cleaned with Betadine x 2.  Grasped anteriorly with a single tooth tenaculum.  Uterus sounded to *** cm.   IUD placed per manufacturer's recommendations.  Strings trimmed to 3 cm. Tenaculum was removed, good hemostasis noted.  Patient tolerated procedure well.   ASSESSMENT:  No diagnosis found.   No orders of the defined types were placed in  this encounter.    Plan:  Patient was given post-procedure instructions.  She was advised to have backup contraception for one week.   Call if you are having increasing pain, cramps or bleeding or if you have a fever greater than 100.4 degrees F., shaking chills, nausea or vomiting. Patient was also asked to check IUD strings periodically and follow up in 4 weeks for IUD check.  No follow-ups on file.  Peggy Speranza B. Malachi Suderman, PA-C 07/02/2023 8:04 PM      Peggy Ressler B. Ilai Hiller, PA-C 07/02/2023 8:04 PM

## 2023-07-03 ENCOUNTER — Encounter: Payer: Self-pay | Admitting: Obstetrics and Gynecology

## 2023-07-03 ENCOUNTER — Ambulatory Visit: Payer: Federal, State, Local not specified - PPO | Admitting: Obstetrics and Gynecology

## 2023-07-03 VITALS — BP 116/80 | Ht 62.0 in | Wt 222.0 lb

## 2023-07-03 DIAGNOSIS — Z3046 Encounter for surveillance of implantable subdermal contraceptive: Secondary | ICD-10-CM | POA: Diagnosis not present

## 2023-07-03 DIAGNOSIS — Z3009 Encounter for other general counseling and advice on contraception: Secondary | ICD-10-CM

## 2023-07-03 MED ORDER — ETONOGESTREL 68 MG ~~LOC~~ IMPL
68.0000 mg | DRUG_IMPLANT | Freq: Once | SUBCUTANEOUS | Status: AC
  Administered 2023-07-03: 68 mg via SUBCUTANEOUS

## 2023-07-03 NOTE — Patient Instructions (Signed)
I value your feedback and you entrusting [place] with your care. If you get a [facility] patient survey, I would appreciate you taking the time to let us know about your experience today. Thank you!  Remove the dressing in 24 hours,  keep the incision area dry for 24 hours and remove the Steristrip in 2-3  days.  Notify us if any signs of tenderness, redness, pain, or fevers develop.

## 2023-08-14 NOTE — Progress Notes (Unsigned)
PCP:  Rica Records, PA-C   No chief complaint on file.    HPI:      [name] is a 37 y.o. [phone] who LMP was No LMP recorded. Patient has had an implant., presents today for her annual examination.  Her menses are occas spotting with nexplanon. Dysmenorrhea none. She does not have intermenstrual bleeding. Does have occas PMS sx. Has noticed wt gain, without focusing on diet/exercise; questions if related to nexplanon.  Sex activity: single partner, contraception - Nexplanon Replaced 07/03/23. Did xulane and nuvaring in past. Doesn't want depo for wt gain. 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 There is a FH of breast cancer in her mom. Her sisters are gene neg. 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 IBIS=24.6%. There is no FH of ovarian cancer. The patient does do self-breast exams. Hasn't had scr breast MRI.   Tobacco use: The patient denies current or previous tobacco use. Alcohol use: social drinker No drug use.  Exercise: min active  She does get adequate calcium and Vitamin D in her diet. No recent labs.   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   Family history of breast cancer    Increased risk of breast cancer 01/2020   IBIS=24.6%    Past Surgical History:  Procedure Laterality Date    2 c-sections  2014 , 2018   CHOLECYSTECTOMY     COLPOSCOPY  2006/2007   WISDOM TOOTH EXTRACTION Bilateral 2008   upper and lower    Family History  Problem Relation Age of Onset   Breast cancer Mother 29   Heart failure Father    Stroke Father    Heart attack Father    Heart disease Maternal Uncle    Stroke Maternal Grandmother    Deep vein thrombosis Paternal Grandmother     Social History   Socioeconomic History   Marital status: Married    Spouse  name: Not on file   Number of children: Not on file   Years of education: Not on file   Highest education level: Not on file  Occupational History   Not on file  Tobacco Use   Smoking status: Never   Smokeless tobacco: Never  Vaping Use   Vaping status: Never Used  Substance and Sexual Activity   Alcohol use: Yes    Comment: occ   Drug use: No   Sexual activity: Yes    Birth control/protection: Implant  Other Topics Concern   Not on file  Social History Narrative   Not on file   Social Determinants of Health   Financial Resource Strain: Not on file  Food Insecurity: Not on file  Transportation Needs: Not on file  Physical Activity: Not on file  Stress: Not on file  Social Connections: Not on file  Intimate Partner Violence: Not on file    Outpatient Medications Prior to Visit  Medication Sig Dispense Refill   albuterol (VENTOLIN HFA) 108 (90 Base) MCG/ACT inhaler Inhale 2 puffs into the lungs every 6 (six) hours as needed for wheezing or shortness of breath. 8 g 0   etonogestrel (NEXPLANON) 68 MG IMPL implant 1 each by Subdermal route once.     No facility-administered medications prior to visit.  ROS:  Review of Systems  Constitutional:  Negative for fatigue, fever and unexpected weight change.  Respiratory:  Negative for cough, shortness of breath and wheezing.   Cardiovascular:  Negative for chest pain, palpitations and leg swelling.  Gastrointestinal:  Negative for blood in stool, constipation, diarrhea, nausea and vomiting.  Endocrine: Negative for cold intolerance, heat intolerance and polyuria.  Genitourinary:  Negative for dyspareunia, dysuria, flank pain, frequency, genital sores, hematuria, menstrual problem, pelvic pain, urgency, vaginal bleeding, vaginal discharge and vaginal pain.  Musculoskeletal:  Negative for back pain, joint swelling and myalgias.  Skin:  Negative for rash.  Neurological:  Negative for dizziness, syncope, light-headedness,  numbness and headaches.  Hematological:  Negative for adenopathy.  Psychiatric/Behavioral:  Negative for agitation, confusion, sleep disturbance and suicidal ideas. The patient is not nervous/anxious.   BREAST: No symptoms   Objective: There were no vitals taken for this visit.   Physical Exam Constitutional:      Appearance: She is well-developed.  Genitourinary:     Vulva normal.     Right Labia: No rash, tenderness or lesions.    Left Labia: No tenderness, lesions or rash.    No vaginal discharge, erythema or tenderness.      Right Adnexa: not tender and no mass present.    Left Adnexa: not tender and no mass present.    No cervical motion tenderness, friability or polyp.     Uterus is not enlarged or tender.  Breasts:    Right: No mass, nipple discharge, skin change or tenderness.     Left: No mass, nipple discharge, skin change or tenderness.  Neck:     Thyroid: No thyromegaly.  Cardiovascular:     Rate and Rhythm: Normal rate and regular rhythm.     Heart sounds: Normal heart sounds. No murmur heard. Pulmonary:     Effort: Pulmonary effort is normal.     Breath sounds: Normal breath sounds.  Abdominal:     Palpations: Abdomen is soft.     Tenderness: There is no abdominal tenderness. There is no guarding or rebound.  Musculoskeletal:        General: Normal range of motion.     Cervical back: Normal range of motion.  Lymphadenopathy:     Cervical: No cervical adenopathy.  Neurological:     General: No focal deficit present.     Mental Status: She is alert and oriented to person, place, and time.     Cranial Nerves: No cranial nerve deficit.  Skin:    General: Skin is warm and dry.  Psychiatric:        Mood and Affect: Mood normal.        Behavior: Behavior normal.        Thought Content: Thought content normal.        Judgment: Judgment normal.  Vitals reviewed.    Assessment/Plan: Encounter for annual routine gynecological examination Encounter for  annual routine gynecological examination  Cervical cancer screening - Plan: Cytology - PAP  Screening for HPV (human papillomavirus) - Plan: Cytology - PAP  Encounter for surveillance of implantable subdermal contraceptive--due for removal 3/24. Discussed replacement or mirena IUD to avoid same systemic hormone exposure in case contributing to wt gain. F/u prn.   Encounter for screening mammogram for malignant neoplasm of breast - Plan: MM 3D SCREEN BREAST BILATERAL  Family history of breast cancer - Plan: MM 3D SCREEN BREAST BILATERAL; pt is MyRisk neg  Increased risk of breast cancer - Plan: MM  3D SCREEN BREAST BILATERAL; pt aware of recommendations of monthly SBE, yearly CBE and mammos, and scr breast MRI. Pt will f/u for MRI ref prn. Cont Vit D supp  Weight gain - Plan: Comprehensive metabolic panel, Lipid panel, Hemoglobin A1c, TSH; check labs. Diet/exercise changes in meantime. Doubt it's related to nexplanon. F/u prn.  BMI 40.0-44.9, adult (HCC) - Plan: Comprehensive metabolic panel, Lipid panel, Hemoglobin A1c, TSH  Blood tests for routine general physical examination - Plan: Comprehensive metabolic panel, Lipid panel, Hemoglobin A1c, TSH  Screening cholesterol level - Plan: Lipid panel  Thyroid disorder screening - Plan: TSH  Screening for diabetes mellitus - Plan: Hemoglobin A1c   GYN counsel breast self exam, mammography screening, adequate intake of calcium and vitamin D, diet and exercise     F/U  No follow-ups on file.  Peggy Elbert B. Leora Platt, PA-C 08/14/2023 3:43 PM

## 2023-08-16 ENCOUNTER — Ambulatory Visit: Payer: Federal, State, Local not specified - PPO | Admitting: Obstetrics and Gynecology

## 2023-08-16 ENCOUNTER — Encounter: Payer: Self-pay | Admitting: Obstetrics and Gynecology

## 2023-08-16 VITALS — BP 107/73 | HR 83 | Ht 62.0 in | Wt 222.0 lb

## 2023-08-16 DIAGNOSIS — Z01419 Encounter for gynecological examination (general) (routine) without abnormal findings: Secondary | ICD-10-CM | POA: Diagnosis not present

## 2023-08-16 DIAGNOSIS — Z9189 Other specified personal risk factors, not elsewhere classified: Secondary | ICD-10-CM

## 2023-08-16 DIAGNOSIS — Z1231 Encounter for screening mammogram for malignant neoplasm of breast: Secondary | ICD-10-CM

## 2023-08-16 DIAGNOSIS — Z803 Family history of malignant neoplasm of breast: Secondary | ICD-10-CM

## 2023-08-16 DIAGNOSIS — Z0000 Encounter for general adult medical examination without abnormal findings: Secondary | ICD-10-CM

## 2023-08-16 DIAGNOSIS — R7303 Prediabetes: Secondary | ICD-10-CM

## 2023-08-16 DIAGNOSIS — Z13220 Encounter for screening for lipoid disorders: Secondary | ICD-10-CM

## 2023-08-16 DIAGNOSIS — Z3046 Encounter for surveillance of implantable subdermal contraceptive: Secondary | ICD-10-CM

## 2023-08-16 NOTE — Patient Instructions (Signed)
I value your feedback and you entrusting us with your care. If you get a [facility] patient survey, I would appreciate you taking the time to let us know about your experience today. Thank you!  [facility] ([place])--[phone]  

## 2023-09-26 ENCOUNTER — Other Ambulatory Visit: Payer: Self-pay

## 2023-09-26 DIAGNOSIS — Z0000 Encounter for general adult medical examination without abnormal findings: Secondary | ICD-10-CM

## 2023-09-26 DIAGNOSIS — Z13220 Encounter for screening for lipoid disorders: Secondary | ICD-10-CM

## 2023-09-26 DIAGNOSIS — R7303 Prediabetes: Secondary | ICD-10-CM

## 2023-09-27 ENCOUNTER — Other Ambulatory Visit: Payer: Self-pay

## 2023-09-28 ENCOUNTER — Other Ambulatory Visit: Payer: Self-pay

## 2023-09-28 DIAGNOSIS — R7303 Prediabetes: Secondary | ICD-10-CM

## 2023-09-28 DIAGNOSIS — Z13220 Encounter for screening for lipoid disorders: Secondary | ICD-10-CM

## 2023-09-28 DIAGNOSIS — Z0000 Encounter for general adult medical examination without abnormal findings: Secondary | ICD-10-CM

## 2023-09-29 LAB — LIPID PANEL

## 2023-10-01 LAB — LIPID PANEL
Cholesterol, Total: 211 mg/dL — ABNORMAL HIGH (ref 100–199)
HDL: 54 mg/dL (ref >39)
LDL CALC COMMENT:: 3.9 ratio (ref 0.0–4.4)
LDL Chol Calc (NIH): 147 mg/dL — ABNORMAL HIGH (ref 0–99)
Triglycerides: 58 mg/dL (ref 0–149)
VLDL Cholesterol Cal: 10 mg/dL (ref 5–40)

## 2023-10-01 LAB — COMPREHENSIVE METABOLIC PANEL
ALT: 22 IU/L (ref 0–32)
AST: 15 IU/L (ref 0–40)
Albumin: 4.1 g/dL (ref 3.9–4.9)
Alkaline Phosphatase: 91 IU/L (ref 44–121)
BUN/Creatinine Ratio: 14 (ref 9–23)
BUN: 12 mg/dL (ref 6–20)
Bilirubin Total: 0.2 mg/dL (ref 0.0–1.2)
CO2: 21 mmol/L (ref 20–29)
Calcium: 9.1 mg/dL (ref 8.7–10.2)
Chloride: 103 mmol/L (ref 96–106)
Creatinine, Ser: 0.84 mg/dL (ref 0.57–1.00)
Globulin, Total: 2.7 g/dL (ref 1.5–4.5)
Glucose: 99 mg/dL (ref 70–99)
Potassium: 4.1 mmol/L (ref 3.5–5.2)
Sodium: 138 mmol/L (ref 134–144)
Total Protein: 6.8 g/dL (ref 6.0–8.5)
eGFR: 91 mL/min/{1.73_m2} (ref >59)

## 2023-10-01 LAB — HEMOGLOBIN A1C
Est. average glucose Bld gHb Est-mCnc: 128 mg/dL
Hgb A1c MFr Bld: 6.1 % — ABNORMAL HIGH (ref 4.8–5.6)

## 2023-10-04 ENCOUNTER — Ambulatory Visit
Admission: RE | Admit: 2023-10-04 | Discharge: 2023-10-04 | Disposition: A | Discharge status: Home / Self Care | Payer: Federal, State, Local not specified - PPO | Source: Ambulatory Visit | Attending: Obstetrics and Gynecology | Admitting: Obstetrics and Gynecology

## 2023-10-04 DIAGNOSIS — Z803 Family history of malignant neoplasm of breast: Secondary | ICD-10-CM | POA: Diagnosis not present

## 2023-10-04 DIAGNOSIS — Z9189 Other specified personal risk factors, not elsewhere classified: Secondary | ICD-10-CM | POA: Insufficient documentation

## 2023-10-04 DIAGNOSIS — Z1231 Encounter for screening mammogram for malignant neoplasm of breast: Secondary | ICD-10-CM | POA: Diagnosis not present

## 2023-11-06 ENCOUNTER — Ambulatory Visit: Payer: Self-pay

## 2023-11-27 ENCOUNTER — Encounter: Payer: Self-pay | Admitting: Oncology

## 2023-11-27 ENCOUNTER — Ambulatory Visit (INDEPENDENT_AMBULATORY_CARE_PROVIDER_SITE_OTHER): Payer: Self-pay | Admitting: Oncology

## 2023-11-27 ENCOUNTER — Other Ambulatory Visit: Payer: Self-pay

## 2023-11-27 VITALS — BP 122/78 | HR 111 | Temp 100.4°F | Ht 62.0 in | Wt 216.0 lb

## 2023-11-27 DIAGNOSIS — R059 Cough, unspecified: Secondary | ICD-10-CM

## 2023-11-27 LAB — POC SOFIA 2 FLU + SARS ANTIGEN FIA
Influenza A, POC: NEGATIVE
Influenza B, POC: NEGATIVE
SARS Coronavirus 2 Ag: NEGATIVE

## 2023-11-27 MED ORDER — PREDNISONE 20 MG PO TABS: 40.0000 mg | ORAL_TABLET | Freq: Every day | ORAL | 0 refills | Status: DC

## 2023-11-27 MED ORDER — OSELTAMIVIR PHOSPHATE 75 MG PO CAPS: 75.0000 mg | ORAL_CAPSULE | Freq: Two times a day (BID) | ORAL | 0 refills | Status: DC

## 2023-11-27 NOTE — Progress Notes (Signed)
[facility] [address] [place], [place] [address]   Office Visit Note  Patient Name: Peggy Garcia Date of Birth [id number]  Medical Record number [id number]  Date of Service: 11/27/2023  Chief Complaint  Patient presents with   Cough    Cough with yellow-green sputum. No known fever. Symptoms began yesterday afternoon. She came home from the [place] the day before symptoms started.    Patient presents today with a cough with greenish-yellow sputum production, fever and fatigue that started yesterday.  Reports she went on an airplane on Sunday traveling home from the [place] thinks she may have picked up something.  Took 1 dose of Alka-Seltzer last night.  Nothing else over-the-counter.    No recent antibiotics. NKDA. No flu vaccine.  Current Medication:  Outpatient Encounter Medications as of 11/27/2023  Medication Sig   albuterol (VENTOLIN HFA) 108 (90 Base) MCG/ACT inhaler Inhale 2 puffs into the lungs every 6 (six) hours as needed for wheezing or shortness of breath.   etonogestrel (NEXPLANON) 68 MG IMPL implant 1 each by Subdermal route once.   No facility-administered encounter medications on file as of 11/27/2023.   Medical History: 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   Family history of breast cancer    Increased risk of breast cancer 01/2020   IBIS=24.6%   Vital Signs: BP 122/78   Pulse (!) 111   Temp (!) 100.4 F (38 C)   Ht 5\' 2"  (1.575 m)   Wt 216 lb (98 kg)   SpO2 98%   BMI 39.51 kg/m   Review of Systems  Constitutional:  Positive for fever. Negative for chills, diaphoresis and fatigue.  HENT:  Positive for congestion, postnasal drip and sore throat. Negative for ear pain, sinus pressure, sinus pain and sneezing.   Respiratory:  Positive for cough.   Gastrointestinal:  Negative for constipation, diarrhea, nausea and vomiting.  Musculoskeletal:  Positive  for myalgias.  Neurological:  Negative for dizziness and headaches.    Physical Exam Constitutional:      Appearance: Normal appearance.  HENT:     Right Ear: A middle ear effusion is present.     Left Ear: A middle ear effusion is present.     Nose: Mucosal edema, congestion and rhinorrhea present.     Right Turbinates: Swollen.     Left Turbinates: Swollen.     Right Sinus: No maxillary sinus tenderness or frontal sinus tenderness.     Left Sinus: No maxillary sinus tenderness or frontal sinus tenderness.     Mouth/Throat:     Mouth: Mucous membranes are moist.     Pharynx: Posterior oropharyngeal erythema and postnasal drip present.     Tonsils: 0 on the right. 0 on the left.  Pulmonary:     Effort: Pulmonary effort is normal. Prolonged expiration present.     Breath sounds: Decreased breath sounds present. No wheezing, rhonchi or rales.  Lymphadenopathy:     Head:     Right side of head: Tonsillar adenopathy present.     Left side of head: Tonsillar adenopathy present.     Cervical: Cervical adenopathy present.  Neurological:     Mental Status: She is alert.     Assessment/Plan: 1. Cough, unspecified type (Primary) Flu and COVID-negative.  Symptom onset was abrupt and appears very viral.  We discussed starting Tamiflu even though testing  was negative.  She was in agreement.  We also discussed trying prednisone 40 mg each morning with breakfast x 5 days to help with inflammation in the lungs.  If no improvement over the next 2 to 3 days, would recommend chest x-ray versus adding an antibiotic.  Patient to send message via MyChart.  We discussed continuing Alka-Seltzer or could try DayQuil/NyQuil to help with congestion, adding Flonase 2 sprays each nostril once or twice a day along with something like Delsym for cough suppressant.  Use Tylenol/ibuprofen as needed for body aches and fever.  Monitor symptoms and may stop over-the-counter stuff as soon as symptoms improve.  -  POC SOFIA 2 FLU + SARS ANTIGEN FIA - oseltamivir (TAMIFLU) 75 MG capsule; Take 1 capsule (75 mg total) by mouth 2 (two) times daily.  Dispense: 10 capsule; Refill: 0 - predniSONE (DELTASONE) 20 MG tablet; Take 2 tablets (40 mg total) by mouth daily with breakfast.  Dispense: 10 tablet; Refill: 0   General Counseling: Parlee verbalizes understanding of the findings of todays visit and agrees with plan of treatment. I have discussed any further diagnostic evaluation that may be needed or ordered today. We also reviewed her medications today. she has been encouraged to call the office with any questions or concerns that should arise related to todays visit.  Time spent:20 Minutes  Durenda Hurt, NP 11/27/2023 9:56 AM

## 2024-08-20 ENCOUNTER — Other Ambulatory Visit: Payer: Self-pay | Admitting: Obstetrics and Gynecology

## 2024-08-20 DIAGNOSIS — Z1231 Encounter for screening mammogram for malignant neoplasm of breast: Secondary | ICD-10-CM

## 2024-09-14 NOTE — Progress Notes (Unsigned)
 PCP:  Peggy Bernarda NOVAK, PA-C   No chief complaint on file.    HPI:      [name] is a 38 y.o. [id number] who LMP was No LMP recorded. Patient has had an implant., presents today for her annual examination.  Her menses are absent with nexplanon , no BTB/dysmen.   Sex activity: single partner, contraception - Nexplanon  Replaced 07/03/23. Did xulane and nuvaring in past.  Last Pap: 07/10/22 Results were: no abnormalities /neg HPV DNA STD hx: HPV on pap 2006  Mammogram: 10/04/23 Results were normal, repeat in 12 months due to FH. Has appt 12/25. There is a FH of breast cancer in her mom. Her sisters are gene neg. 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 IBIS=24.6%. There is no FH of ovarian cancer. The patient does self-breast exams. Hasn't had scr breast MRI.   Tobacco use: The patient denies current or previous tobacco use. Alcohol use: social drinker No drug use.  Exercise: min active  She does get adequate calcium and Vitamin D in her diet. Labs last yr with pre-DM, borderline LDL.   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   Family history of breast cancer    Increased risk of breast cancer 01/2020   IBIS=24.6%    Past Surgical History:  Procedure Laterality Date    2 c-sections  2014 , 2018   CHOLECYSTECTOMY     COLPOSCOPY  2006/2007   WISDOM TOOTH EXTRACTION Bilateral 2008   upper and lower    Family History  Problem Relation Age of Onset   Breast cancer Mother 64   Heart failure Father    Stroke Father    Heart attack Father    Heart disease Maternal Uncle    Stroke Maternal Grandmother    Deep vein thrombosis Paternal Grandmother     Social History   Socioeconomic History   Marital status: Married    Spouse name: Not on file   Number of children: Not on file   Years of education: Not on file   Highest education level: Not on file  Occupational History   Not on  file  Tobacco Use   Smoking status: Never   Smokeless tobacco: Never  Vaping Use   Vaping status: Never Used  Substance and Sexual Activity   Alcohol use: Yes    Comment: occ   Drug use: No   Sexual activity: Yes    Birth control/protection: Implant  Other Topics Concern   Not on file  Social History Narrative   Not on file   Social Drivers of Health   Financial Resource Strain: Not on file  Food Insecurity: Not on file  Transportation Needs: Not on file  Physical Activity: Not on file  Stress: Not on file  Social Connections: Not on file  Intimate Partner Violence: Not on file    Outpatient Medications Prior to Visit  Medication Sig Dispense Refill   albuterol  (VENTOLIN  HFA) 108 (90 Base) MCG/ACT inhaler Inhale 2 puffs into the lungs every 6 (six) hours as needed for wheezing or shortness of breath. 8 g 0   etonogestrel  (NEXPLANON ) 68 MG IMPL implant 1 each by Subdermal route once.     oseltamivir  (TAMIFLU ) 75 MG capsule Take 1 capsule (75 mg total) by mouth 2 (two) times daily. 10 capsule 0   predniSONE  (DELTASONE ) 20 MG tablet Take 2  tablets (40 mg total) by mouth daily with breakfast. 10 tablet 0   No facility-administered medications prior to visit.      ROS:  Review of Systems  Constitutional:  Negative for fatigue, fever and unexpected weight change.  Respiratory:  Negative for cough, shortness of breath and wheezing.   Cardiovascular:  Negative for chest pain, palpitations and leg swelling.  Gastrointestinal:  Negative for blood in stool, constipation, diarrhea, nausea and vomiting.  Endocrine: Negative for cold intolerance, heat intolerance and polyuria.  Genitourinary:  Negative for dyspareunia, dysuria, flank pain, frequency, genital sores, hematuria, menstrual problem, pelvic pain, urgency, vaginal bleeding, vaginal discharge and vaginal pain.  Musculoskeletal:  Negative for back pain, joint swelling and myalgias.  Skin:  Negative for rash.   Neurological:  Negative for dizziness, syncope, light-headedness, numbness and headaches.  Hematological:  Negative for adenopathy.  Psychiatric/Behavioral:  Negative for agitation, confusion, sleep disturbance and suicidal ideas. The patient is not nervous/anxious.   BREAST: No symptoms   Objective: There were no vitals taken for this visit.   Physical Exam Constitutional:      Appearance: She is well-developed.  Genitourinary:     Vulva normal.     Right Labia: No rash, tenderness or lesions.    Left Labia: No tenderness, lesions or rash.    No vaginal discharge, erythema or tenderness.      Right Adnexa: not tender and no mass present.    Left Adnexa: not tender and no mass present.    No cervical motion tenderness, friability or polyp.     Uterus is not enlarged or tender.  Breasts:    Right: No mass, nipple discharge, skin change or tenderness.     Left: No mass, nipple discharge, skin change or tenderness.  Neck:     Thyroid : No thyromegaly.  Cardiovascular:     Rate and Rhythm: Normal rate and regular rhythm.     Heart sounds: Normal heart sounds. No murmur heard. Pulmonary:     Effort: Pulmonary effort is normal.     Breath sounds: Normal breath sounds.  Abdominal:     Palpations: Abdomen is soft.     Tenderness: There is no abdominal tenderness. There is no guarding or rebound.  Musculoskeletal:        General: Normal range of motion.     Cervical back: Normal range of motion.  Lymphadenopathy:     Cervical: No cervical adenopathy.  Neurological:     General: No focal deficit present.     Mental Status: She is alert and oriented to person, place, and time.     Cranial Nerves: No cranial nerve deficit.  Skin:    General: Skin is warm and dry.  Psychiatric:        Mood and Affect: Mood normal.        Behavior: Behavior normal.        Thought Content: Thought content normal.        Judgment: Judgment normal.  Vitals reviewed.     Assessment/Plan: Encounter for annual routine gynecological examination  Encounter for surveillance of implantable subdermal contraceptive; has 3 yr indication  Encounter for screening mammogram for malignant neoplasm of breast - Plan: MM 3D SCREENING MAMMOGRAM BILATERAL BREAST; pt to schedule mammo  Family history of breast cancer - Plan: MM 3D SCREEN BREAST BILATERAL; pt is MyRisk neg  Increased risk of breast cancer - Plan: MM 3D SCREEN BREAST BILATERAL; pt aware of recommendations of monthly SBE, yearly CBE and mammos,  and scr breast MRI. Pt will f/u for MRI ref prn. Cont Vit D supp  Blood tests for routine general physical examination - Plan: Comprehensive metabolic panel, Hemoglobin A1c, Lipid panel  Pre-diabetes - Plan: Hemoglobin A1c  Screening cholesterol level - Plan: Lipid panel   GYN counsel breast self exam, mammography screening, adequate intake of calcium and vitamin D, diet and exercise     F/U  No follow-ups on file.  Jesalyn Finazzo B. Rishik Tubby, PA-C 09/14/2024 6:54 AM

## 2024-09-15 ENCOUNTER — Ambulatory Visit: Admitting: Obstetrics and Gynecology

## 2024-09-15 ENCOUNTER — Encounter: Payer: Self-pay | Admitting: Obstetrics and Gynecology

## 2024-09-15 VITALS — BP 116/69 | HR 92 | Ht 62.0 in | Wt 197.0 lb

## 2024-09-15 DIAGNOSIS — Z3046 Encounter for surveillance of implantable subdermal contraceptive: Secondary | ICD-10-CM

## 2024-09-15 DIAGNOSIS — Z13220 Encounter for screening for lipoid disorders: Secondary | ICD-10-CM

## 2024-09-15 DIAGNOSIS — Z01411 Encounter for gynecological examination (general) (routine) with abnormal findings: Secondary | ICD-10-CM | POA: Diagnosis not present

## 2024-09-15 DIAGNOSIS — Z975 Presence of (intrauterine) contraceptive device: Secondary | ICD-10-CM | POA: Diagnosis not present

## 2024-09-15 DIAGNOSIS — Z1231 Encounter for screening mammogram for malignant neoplasm of breast: Secondary | ICD-10-CM

## 2024-09-15 DIAGNOSIS — R7303 Prediabetes: Secondary | ICD-10-CM

## 2024-09-15 DIAGNOSIS — Z803 Family history of malignant neoplasm of breast: Secondary | ICD-10-CM

## 2024-09-15 DIAGNOSIS — Z01419 Encounter for gynecological examination (general) (routine) without abnormal findings: Secondary | ICD-10-CM

## 2024-09-15 DIAGNOSIS — Z0000 Encounter for general adult medical examination without abnormal findings: Secondary | ICD-10-CM

## 2024-09-15 DIAGNOSIS — N921 Excessive and frequent menstruation with irregular cycle: Secondary | ICD-10-CM | POA: Diagnosis not present

## 2024-09-15 DIAGNOSIS — Z1329 Encounter for screening for other suspected endocrine disorder: Secondary | ICD-10-CM

## 2024-09-15 DIAGNOSIS — N939 Abnormal uterine and vaginal bleeding, unspecified: Secondary | ICD-10-CM

## 2024-09-15 DIAGNOSIS — Z9189 Other specified personal risk factors, not elsewhere classified: Secondary | ICD-10-CM

## 2024-09-15 MED ORDER — NORETHINDRONE 0.35 MG PO TABS: 1.0000 | ORAL_TABLET | Freq: Every day | ORAL | 0 refills | Status: AC

## 2024-09-15 NOTE — Patient Instructions (Signed)
 I value your feedback and you entrusting [place] with your care. If you get a [place] patient survey, I would appreciate you taking the time to let us know about your experience today. Thank you! ? ? ?

## 2024-09-16 ENCOUNTER — Ambulatory Visit: Payer: Self-pay | Admitting: Obstetrics and Gynecology

## 2024-09-16 LAB — LIPID PANEL
Chol/HDL Ratio: 3 ratio (ref 0.0–4.4)
Cholesterol, Total: 192 mg/dL (ref 100–199)
HDL: 65 mg/dL (ref >39)
LDL Chol Calc (NIH): 117 mg/dL — ABNORMAL HIGH (ref 0–99)
Triglycerides: 53 mg/dL (ref 0–149)
VLDL Cholesterol Cal: 10 mg/dL (ref 5–40)

## 2024-09-16 LAB — CBC WITH DIFFERENTIAL/PLATELET
Basophils Absolute: 0.1 x10E3/uL (ref 0.0–0.2)
Basos: 1 %
EOS (ABSOLUTE): 0.1 x10E3/uL (ref 0.0–0.4)
Eos: 1 %
Hematocrit: 39.5 % (ref 34.0–46.6)
Hemoglobin: 12.5 g/dL (ref 11.1–15.9)
Immature Grans (Abs): 0 x10E3/uL (ref 0.0–0.1)
Immature Granulocytes: 0 %
Lymphocytes Absolute: 2.5 x10E3/uL (ref 0.7–3.1)
Lymphs: 27 %
MCH: 26.8 pg (ref 26.6–33.0)
MCHC: 31.6 g/dL (ref 31.5–35.7)
MCV: 85 fL (ref 79–97)
Monocytes Absolute: 0.5 x10E3/uL (ref 0.1–0.9)
Monocytes: 5 %
Neutrophils Absolute: 5.9 x10E3/uL (ref 1.4–7.0)
Neutrophils: 66 %
Platelets: 278 x10E3/uL (ref 150–450)
RBC: 4.66 x10E6/uL (ref 3.77–5.28)
RDW: 13.1 % (ref 11.7–15.4)
WBC: 9 x10E3/uL (ref 3.4–10.8)

## 2024-09-16 LAB — COMPREHENSIVE METABOLIC PANEL WITH GFR
ALT: 15 IU/L (ref 0–32)
AST: 15 IU/L (ref 0–40)
Albumin: 3.9 g/dL (ref 3.9–4.9)
Alkaline Phosphatase: 62 IU/L (ref 41–116)
BUN/Creatinine Ratio: 13 (ref 9–23)
BUN: 10 mg/dL (ref 6–24)
Bilirubin Total: <0.2 mg/dL (ref 0.0–1.2)
CO2: 21 mmol/L (ref 20–29)
Calcium: 8.4 mg/dL — ABNORMAL LOW (ref 8.7–10.2)
Chloride: 103 mmol/L (ref 96–106)
Creatinine, Ser: 0.76 mg/dL (ref 0.57–1.00)
Globulin, Total: 2.4 g/dL (ref 1.5–4.5)
Glucose: 86 mg/dL (ref 70–99)
Potassium: 3.9 mmol/L (ref 3.5–5.2)
Sodium: 136 mmol/L (ref 134–144)
Total Protein: 6.3 g/dL (ref 6.0–8.5)
eGFR: 102 mL/min/1.73 (ref >59)

## 2024-09-16 LAB — TSH: TSH: 1.61 u[IU]/mL (ref 0.450–4.500)

## 2024-09-16 LAB — HEMOGLOBIN A1C
Est. average glucose Bld gHb Est-mCnc: 108 mg/dL
Hgb A1c MFr Bld: 5.4 % (ref 4.8–5.6)

## 2024-10-06 ENCOUNTER — Ambulatory Visit
Admission: RE | Admit: 2024-10-06 | Discharge: 2024-10-06 | Disposition: A | Discharge status: Home / Self Care | Source: Ambulatory Visit | Attending: Obstetrics and Gynecology | Admitting: Obstetrics and Gynecology

## 2024-10-06 DIAGNOSIS — Z1231 Encounter for screening mammogram for malignant neoplasm of breast: Secondary | ICD-10-CM | POA: Insufficient documentation

## 2024-10-13 ENCOUNTER — Ambulatory Visit: Payer: Self-pay | Admitting: Obstetrics and Gynecology
